# Patient Record
Sex: Female | Born: 1935 | Race: Black or African American | Hispanic: No | Smoking: Never smoker
Health system: Southern US, Community
[De-identification: ages and names within clinical notes are randomized; demographics above are authoritative.]

## PROBLEM LIST (undated history)

## (undated) DIAGNOSIS — N814 Uterovaginal prolapse, unspecified: Secondary | ICD-10-CM

## (undated) DIAGNOSIS — R748 Abnormal levels of other serum enzymes: Secondary | ICD-10-CM

## (undated) DIAGNOSIS — M1712 Unilateral primary osteoarthritis, left knee: Secondary | ICD-10-CM

## (undated) DIAGNOSIS — F0391 Unspecified dementia with behavioral disturbance: Secondary | ICD-10-CM

## (undated) DIAGNOSIS — Z8711 Personal history of peptic ulcer disease: Secondary | ICD-10-CM

## (undated) DIAGNOSIS — R002 Palpitations: Secondary | ICD-10-CM

## (undated) DIAGNOSIS — K219 Gastro-esophageal reflux disease without esophagitis: Secondary | ICD-10-CM

## (undated) DIAGNOSIS — J302 Other seasonal allergic rhinitis: Secondary | ICD-10-CM

## (undated) DIAGNOSIS — R451 Restlessness and agitation: Secondary | ICD-10-CM

## (undated) DIAGNOSIS — M1990 Unspecified osteoarthritis, unspecified site: Secondary | ICD-10-CM

## (undated) DIAGNOSIS — N3941 Urge incontinence: Secondary | ICD-10-CM

## (undated) DIAGNOSIS — I10 Essential (primary) hypertension: Secondary | ICD-10-CM

## (undated) DIAGNOSIS — R634 Abnormal weight loss: Secondary | ICD-10-CM

## (undated) DIAGNOSIS — D649 Anemia, unspecified: Secondary | ICD-10-CM

## (undated) DIAGNOSIS — F32A Depression, unspecified: Secondary | ICD-10-CM

## (undated) DIAGNOSIS — F329 Major depressive disorder, single episode, unspecified: Secondary | ICD-10-CM

## (undated) HISTORY — DX: Restlessness and agitation: R45.1

## (undated) HISTORY — PX: JOINT REPLACEMENT: SHX530

## (undated) HISTORY — DX: Essential (primary) hypertension: I10

## (undated) HISTORY — DX: Personal history of peptic ulcer disease: Z87.11

## (undated) HISTORY — DX: Unilateral primary osteoarthritis, left knee: M17.12

## (undated) HISTORY — PX: BREAST SURGERY: SHX581

## (undated) HISTORY — DX: Palpitations: R00.2

## (undated) HISTORY — DX: Anemia, unspecified: D64.9

## (undated) HISTORY — DX: Gastro-esophageal reflux disease without esophagitis: K21.9

## (undated) HISTORY — DX: Uterovaginal prolapse, unspecified: N81.4

## (undated) HISTORY — DX: Abnormal weight loss: R63.4

## (undated) HISTORY — DX: Unspecified dementia with behavioral disturbance: F03.91

## (undated) HISTORY — DX: Major depressive disorder, single episode, unspecified: F32.9

## (undated) HISTORY — DX: Urge incontinence: N39.41

## (undated) HISTORY — DX: Depression, unspecified: F32.A

## (undated) HISTORY — DX: Abnormal levels of other serum enzymes: R74.8

---

## 1997-12-09 ENCOUNTER — Ambulatory Visit (HOSPITAL_COMMUNITY): Admission: RE | Admit: 1997-12-09 | Discharge: 1997-12-09 | Payer: Self-pay | Admitting: *Deleted

## 1997-12-15 ENCOUNTER — Ambulatory Visit (HOSPITAL_COMMUNITY): Admission: RE | Admit: 1997-12-15 | Discharge: 1997-12-15 | Payer: Self-pay

## 1998-08-05 ENCOUNTER — Encounter: Admission: RE | Admit: 1998-08-05 | Discharge: 1998-08-05 | Payer: Self-pay | Admitting: Family Medicine

## 1998-08-06 ENCOUNTER — Encounter: Admission: RE | Admit: 1998-08-06 | Discharge: 1998-08-06 | Payer: Self-pay | Admitting: Family Medicine

## 1998-10-09 ENCOUNTER — Ambulatory Visit (HOSPITAL_COMMUNITY): Admission: RE | Admit: 1998-10-09 | Discharge: 1998-10-09 | Payer: Self-pay | Admitting: Family Medicine

## 1998-12-07 ENCOUNTER — Encounter: Admission: RE | Admit: 1998-12-07 | Discharge: 1998-12-07 | Payer: Self-pay | Admitting: Family Medicine

## 1999-03-19 ENCOUNTER — Encounter: Admission: RE | Admit: 1999-03-19 | Discharge: 1999-03-19 | Payer: Self-pay | Admitting: Sports Medicine

## 1999-10-25 ENCOUNTER — Encounter: Payer: Self-pay | Admitting: Family Medicine

## 1999-10-25 ENCOUNTER — Ambulatory Visit (HOSPITAL_COMMUNITY): Admission: RE | Admit: 1999-10-25 | Discharge: 1999-10-25 | Payer: Self-pay | Admitting: Family Medicine

## 2000-03-21 ENCOUNTER — Encounter: Admission: RE | Admit: 2000-03-21 | Discharge: 2000-03-21 | Payer: Self-pay | Admitting: Family Medicine

## 2000-08-28 ENCOUNTER — Encounter: Admission: RE | Admit: 2000-08-28 | Discharge: 2000-08-28 | Payer: Self-pay | Admitting: Family Medicine

## 2000-10-27 ENCOUNTER — Ambulatory Visit (HOSPITAL_COMMUNITY): Admission: RE | Admit: 2000-10-27 | Discharge: 2000-10-27 | Payer: Self-pay | Admitting: Family Medicine

## 2000-10-27 ENCOUNTER — Encounter: Payer: Self-pay | Admitting: Family Medicine

## 2000-11-02 ENCOUNTER — Encounter: Admission: RE | Admit: 2000-11-02 | Discharge: 2000-11-02 | Payer: Self-pay | Admitting: Family Medicine

## 2001-03-21 ENCOUNTER — Encounter: Admission: RE | Admit: 2001-03-21 | Discharge: 2001-03-21 | Payer: Self-pay | Admitting: Family Medicine

## 2001-08-21 ENCOUNTER — Encounter (INDEPENDENT_AMBULATORY_CARE_PROVIDER_SITE_OTHER): Payer: Self-pay | Admitting: *Deleted

## 2001-08-21 LAB — CONVERTED CEMR LAB

## 2001-08-30 ENCOUNTER — Encounter (INDEPENDENT_AMBULATORY_CARE_PROVIDER_SITE_OTHER): Payer: Self-pay | Admitting: *Deleted

## 2001-08-30 ENCOUNTER — Encounter: Admission: RE | Admit: 2001-08-30 | Discharge: 2001-08-30 | Payer: Self-pay | Admitting: Family Medicine

## 2001-10-31 ENCOUNTER — Encounter: Admission: RE | Admit: 2001-10-31 | Discharge: 2001-10-31 | Payer: Self-pay | Admitting: Family Medicine

## 2001-11-02 ENCOUNTER — Ambulatory Visit (HOSPITAL_COMMUNITY): Admission: RE | Admit: 2001-11-02 | Discharge: 2001-11-02 | Payer: Self-pay | Admitting: Family Medicine

## 2001-11-02 ENCOUNTER — Encounter: Payer: Self-pay | Admitting: Family Medicine

## 2002-03-04 ENCOUNTER — Encounter: Admission: RE | Admit: 2002-03-04 | Discharge: 2002-03-04 | Payer: Self-pay | Admitting: Family Medicine

## 2002-09-12 ENCOUNTER — Encounter: Admission: RE | Admit: 2002-09-12 | Discharge: 2002-09-12 | Payer: Self-pay | Admitting: Family Medicine

## 2002-11-05 ENCOUNTER — Ambulatory Visit (HOSPITAL_COMMUNITY): Admission: RE | Admit: 2002-11-05 | Discharge: 2002-11-05 | Payer: Self-pay | Admitting: Family Medicine

## 2003-03-19 ENCOUNTER — Encounter: Admission: RE | Admit: 2003-03-19 | Discharge: 2003-03-19 | Payer: Self-pay | Admitting: Family Medicine

## 2003-07-21 ENCOUNTER — Encounter: Admission: RE | Admit: 2003-07-21 | Discharge: 2003-07-21 | Payer: Self-pay | Admitting: Family Medicine

## 2003-07-24 ENCOUNTER — Encounter: Admission: RE | Admit: 2003-07-24 | Discharge: 2003-07-24 | Payer: Self-pay | Admitting: Family Medicine

## 2003-11-11 ENCOUNTER — Ambulatory Visit (HOSPITAL_COMMUNITY): Admission: RE | Admit: 2003-11-11 | Discharge: 2003-11-11 | Payer: Self-pay | Admitting: Family Medicine

## 2003-11-20 ENCOUNTER — Encounter: Admission: RE | Admit: 2003-11-20 | Discharge: 2003-11-20 | Payer: Self-pay | Admitting: Sports Medicine

## 2003-12-02 ENCOUNTER — Encounter: Admission: RE | Admit: 2003-12-02 | Discharge: 2003-12-02 | Payer: Self-pay | Admitting: Family Medicine

## 2003-12-25 ENCOUNTER — Encounter: Admission: RE | Admit: 2003-12-25 | Discharge: 2003-12-25 | Payer: Self-pay | Admitting: Family Medicine

## 2004-02-25 ENCOUNTER — Ambulatory Visit: Payer: Self-pay | Admitting: Family Medicine

## 2004-03-08 ENCOUNTER — Ambulatory Visit: Payer: Self-pay | Admitting: Sports Medicine

## 2004-03-18 ENCOUNTER — Ambulatory Visit: Payer: Self-pay | Admitting: Family Medicine

## 2004-11-11 ENCOUNTER — Ambulatory Visit (HOSPITAL_COMMUNITY): Admission: RE | Admit: 2004-11-11 | Discharge: 2004-11-11 | Payer: Self-pay | Admitting: Family Medicine

## 2004-12-06 ENCOUNTER — Ambulatory Visit: Payer: Self-pay | Admitting: Family Medicine

## 2005-04-04 ENCOUNTER — Ambulatory Visit: Payer: Self-pay | Admitting: Family Medicine

## 2005-11-28 ENCOUNTER — Ambulatory Visit: Payer: Self-pay | Admitting: Family Medicine

## 2005-12-06 ENCOUNTER — Ambulatory Visit (HOSPITAL_COMMUNITY): Admission: RE | Admit: 2005-12-06 | Discharge: 2005-12-06 | Payer: Self-pay | Admitting: Family Medicine

## 2005-12-14 ENCOUNTER — Encounter: Admission: RE | Admit: 2005-12-14 | Discharge: 2005-12-14 | Payer: Self-pay | Admitting: Sports Medicine

## 2006-02-09 ENCOUNTER — Ambulatory Visit: Payer: Self-pay | Admitting: Family Medicine

## 2006-05-25 ENCOUNTER — Ambulatory Visit: Payer: Self-pay | Admitting: Family Medicine

## 2006-07-20 DIAGNOSIS — IMO0002 Reserved for concepts with insufficient information to code with codable children: Secondary | ICD-10-CM | POA: Insufficient documentation

## 2006-07-20 DIAGNOSIS — M1710 Unilateral primary osteoarthritis, unspecified knee: Secondary | ICD-10-CM

## 2006-07-20 DIAGNOSIS — N951 Menopausal and female climacteric states: Secondary | ICD-10-CM | POA: Insufficient documentation

## 2006-07-21 ENCOUNTER — Encounter (INDEPENDENT_AMBULATORY_CARE_PROVIDER_SITE_OTHER): Payer: Self-pay | Admitting: *Deleted

## 2006-10-19 ENCOUNTER — Ambulatory Visit: Payer: Self-pay | Admitting: Family Medicine

## 2006-10-20 ENCOUNTER — Telehealth: Payer: Self-pay | Admitting: Family Medicine

## 2006-10-20 DIAGNOSIS — R030 Elevated blood-pressure reading, without diagnosis of hypertension: Secondary | ICD-10-CM

## 2006-10-30 ENCOUNTER — Telehealth: Payer: Self-pay | Admitting: *Deleted

## 2006-10-30 ENCOUNTER — Encounter: Payer: Self-pay | Admitting: Family Medicine

## 2006-11-20 ENCOUNTER — Telehealth: Payer: Self-pay | Admitting: *Deleted

## 2006-11-21 ENCOUNTER — Ambulatory Visit: Payer: Self-pay | Admitting: Family Medicine

## 2006-12-26 ENCOUNTER — Ambulatory Visit (HOSPITAL_COMMUNITY): Admission: RE | Admit: 2006-12-26 | Discharge: 2006-12-26 | Payer: Self-pay | Admitting: Family Medicine

## 2006-12-27 ENCOUNTER — Encounter: Payer: Self-pay | Admitting: Family Medicine

## 2007-03-29 ENCOUNTER — Ambulatory Visit: Payer: Self-pay | Admitting: Family Medicine

## 2007-06-15 ENCOUNTER — Ambulatory Visit: Payer: Self-pay | Admitting: Family Medicine

## 2007-06-15 ENCOUNTER — Encounter: Payer: Self-pay | Admitting: *Deleted

## 2007-06-15 ENCOUNTER — Telehealth: Payer: Self-pay | Admitting: *Deleted

## 2007-06-15 ENCOUNTER — Encounter: Payer: Self-pay | Admitting: Family Medicine

## 2007-06-15 DIAGNOSIS — D62 Acute posthemorrhagic anemia: Secondary | ICD-10-CM | POA: Insufficient documentation

## 2007-06-15 DIAGNOSIS — I951 Orthostatic hypotension: Secondary | ICD-10-CM | POA: Insufficient documentation

## 2007-06-18 LAB — CONVERTED CEMR LAB
ALT: 11 units/L (ref 0–35)
BUN: 42 mg/dL — ABNORMAL HIGH (ref 6–23)
CO2: 22 meq/L (ref 19–32)
MCV: 100.7 fL — ABNORMAL HIGH (ref 78.0–100.0)
RBC: 2.86 M/uL — ABNORMAL LOW (ref 3.87–5.11)
RDW: 13.2 % (ref 11.5–15.5)
Sodium: 141 meq/L (ref 135–145)

## 2007-08-09 ENCOUNTER — Telehealth: Payer: Self-pay | Admitting: *Deleted

## 2007-08-10 ENCOUNTER — Ambulatory Visit: Payer: Self-pay | Admitting: Family Medicine

## 2007-08-10 ENCOUNTER — Encounter: Payer: Self-pay | Admitting: Family Medicine

## 2007-10-08 ENCOUNTER — Ambulatory Visit: Payer: Self-pay | Admitting: Family Medicine

## 2007-10-11 ENCOUNTER — Telehealth (INDEPENDENT_AMBULATORY_CARE_PROVIDER_SITE_OTHER): Payer: Self-pay | Admitting: Family Medicine

## 2007-10-29 ENCOUNTER — Inpatient Hospital Stay (HOSPITAL_COMMUNITY): Admission: RE | Admit: 2007-10-29 | Discharge: 2007-11-02 | Payer: Self-pay | Admitting: Orthopedic Surgery

## 2008-02-15 ENCOUNTER — Ambulatory Visit: Payer: Self-pay | Admitting: Family Medicine

## 2008-04-11 ENCOUNTER — Telehealth: Payer: Self-pay | Admitting: *Deleted

## 2008-06-11 ENCOUNTER — Encounter: Payer: Self-pay | Admitting: Family Medicine

## 2008-09-03 ENCOUNTER — Telehealth: Payer: Self-pay | Admitting: Family Medicine

## 2008-09-16 ENCOUNTER — Telehealth: Payer: Self-pay | Admitting: *Deleted

## 2008-10-29 ENCOUNTER — Ambulatory Visit: Payer: Self-pay | Admitting: Family Medicine

## 2008-10-29 DIAGNOSIS — C4499 Other specified malignant neoplasm of skin, unspecified: Secondary | ICD-10-CM

## 2009-07-21 ENCOUNTER — Telehealth: Payer: Self-pay | Admitting: Family Medicine

## 2009-11-12 ENCOUNTER — Ambulatory Visit: Payer: Self-pay | Admitting: Family Medicine

## 2009-11-12 ENCOUNTER — Encounter: Payer: Self-pay | Admitting: Family Medicine

## 2009-11-12 DIAGNOSIS — Z8711 Personal history of peptic ulcer disease: Secondary | ICD-10-CM

## 2009-11-12 HISTORY — DX: Personal history of peptic ulcer disease: Z87.11

## 2009-11-12 LAB — CONVERTED CEMR LAB
ALT: 8 units/L (ref 0–35)
Alkaline Phosphatase: 71 units/L (ref 39–117)
CO2: 24 meq/L (ref 19–32)
Chloride: 103 meq/L (ref 96–112)
Glucose, Bld: 89 mg/dL (ref 70–99)
MCHC: 32.3 g/dL (ref 30.0–36.0)
MCV: 101 fL — ABNORMAL HIGH (ref 78.0–100.0)
RBC: 3.99 M/uL (ref 3.87–5.11)
RDW: 13.2 % (ref 11.5–15.5)
Sodium: 139 meq/L (ref 135–145)

## 2009-11-16 ENCOUNTER — Telehealth: Payer: Self-pay | Admitting: Family Medicine

## 2009-12-14 ENCOUNTER — Encounter: Payer: Self-pay | Admitting: Family Medicine

## 2010-02-15 ENCOUNTER — Telehealth: Payer: Self-pay | Admitting: Family Medicine

## 2010-02-18 ENCOUNTER — Encounter: Payer: Self-pay | Admitting: Family Medicine

## 2010-03-03 ENCOUNTER — Telehealth: Payer: Self-pay | Admitting: Family Medicine

## 2010-06-13 ENCOUNTER — Encounter: Payer: Self-pay | Admitting: Family Medicine

## 2010-06-24 NOTE — Progress Notes (Signed)
Summary: refill   Phone Note Refill Request Call back at Home Phone [phone] Message from:  Patient  Refills Requested: Medication #1:  NAPROSYN 500 MG  TABS One tablet two to three times daily as needed for osteoarthritis pain Disp:100 tab [facility]  Initial call taken by: De Nurse,  July 21, 2009 2:35 PM  Follow-up for Phone Call        I will fill her a 1 month supply, no refills.  She needs to make an appt to come see me before continuing this medication.  Thanks Follow-up by: Renold Don MD,  July 21, 2009 2:48 PM  Additional Follow-up for Phone Call Additional follow up Details #1::        Can this be printed and placed in to be faxed slot?      Prescriptions: NAPROSYN 500 MG  TABS (NAPROXEN) One tablet two to three times daily as needed for osteoarthritis pain Disp:100 tab  #90 x 0   Entered and Authorized by:   Renold Don MD   Signed by:   Renold Don MD on 07/23/2009   Method used:   Print then Give to Patient   RxID:   [id number] NAPROSYN 500 MG  TABS (NAPROXEN) One tablet two to three times daily as needed for osteoarthritis pain Disp:100 tab  #90 x 0   Entered and Authorized by:   Renold Don MD   Signed by:   Renold Don MD on 07/23/2009   Method used:   Print then Give to Patient   RxID:   [id number]   Appended Document: refill faxed to insurance company

## 2010-06-24 NOTE — Progress Notes (Signed)
   Phone Note Outgoing Call   Call placed by: Renold Don MD,  November 16, 2009 1:42 PM Call placed to: Patient Summary of Call: Spoke with patient and relayed her latest lab work.  Patient pleased with results.  Renold Don MD  November 16, 2009 1:42 PM

## 2010-06-24 NOTE — Miscellaneous (Signed)
Summary: [facility] refill   Clinical Lists Changes pt brought in a form for md to complete & refill her naproxyn. to pcp.Golden Circle RN  December 14, 2009 4:43 PM  Completed and given left on Triage Nurse desk, was after 5 pm today. Renold Don MD  December 16, 2009 8:55 PM   Appended Document: [facility] refill faxed to [facility]

## 2010-06-24 NOTE — Assessment & Plan Note (Signed)
Summary: place on stomach & ck up,tcb   Vital Signs:  Patient profile:   77 year old female Height:      63 inches Weight:      147.4 pounds BMI:     26.21 Temp:     98.4 degrees F oral Pulse rate:   68 / minute BP sitting:   146 / 79  (left arm) Cuff size:   regular  Vitals Entered By: Gladstone Pih (November 12, 2009 1:58 PM)  CC: check up Is Patient Diabetic? No Pain Assessment Patient in pain? no        Primary Care Provider:  Renold Don MD  CC:  check up.  History of Present Illness: CC:  well woman check  1.  Well woman check:  Patient states she keeps very close control on her health.  Takes multiple supplements.  States she has had problem with anemia in past with distant rectal bleeding, but none for many years.    2.  "Spot" on stomach:  Patient originally seen June of last year for a mole on her stomach.  Set up with Dermatology at that time, but did not follow up due to scheduling problems.  Said she kept calling dermatologist but he was always out of town.  First noticed lesion age 18, no change in color or size since then.  No itching, bleeding at site.  ROS:  no headaches, pre-syncopal or syncopal episodes, chest pain, palpitations, shortness of breath or dyspnea, abdominal pain, diarrhea or constipation, melena, hematochezia, lower extremity swelling.      Habits & Providers  Alcohol-Tobacco-Diet     Tobacco Status: never  Current Problems (verified): 1)  Personal History of Peptic Ulcer Disease  (ICD-V12.71) 2)  Other Malignant Neoplasm Other Spec Sites Skin  (ICD-173.8) 3)  Need Prophylactic Vaccination&inoculation Flu  (ICD-V04.81) 4)  Anemia, Secondary To Acute Blood Loss  (ICD-285.1) 5)  Postural Hypotension  (ICD-458.0) 6)  Need Prophylactic Vaccination&inoculation Flu  (ICD-V04.81) 7)  Abnormal Findings, Elevated Bp w/o Htn  (ICD-796.2) 8)  Osteoarthritis, Lower Leg  (ICD-715.96) 9)  Menopausal Syndrome  (ICD-627.2)  Current Medications  (verified): 1)  Naprosyn 500 Mg  Tabs (Naproxen) .... One Tablet Two To Three Times Daily As Needed For Osteoarthritis Pain Disp:100 Tab 2)  Omeprazole 40 Mg  Cpdr (Omeprazole) .Marland Kitchen.. 1 Tab By Mouth Daily 3)  Caltrate 600+d 600-400 Mg-Unit Tabs (Calcium Carbonate-Vitamin D) 4)  Centrum Silver  Tabs (Multiple Vitamins-Minerals) 5)  Ginkgo 60 Mg Tabs (Ginkgo Biloba) 6)  Potassium Gluconate 595 Mg Tabs (Potassium Gluconate)  Allergies (verified): No Known Drug Allergies   Past History:  Past medical, surgical, family and social histories (including risk factors) reviewed, and no changes noted (except as noted below).  Past Medical History: Reviewed history from 10/19/2006 and no changes required. 10-year risk for CV event = 4%,  Abnormal Mammogram, L. Breast (07/07),  HDL=62 08/2001,  Hx Anemia,  [place]. and [place] with DMT2,  No further Pap screening required per USPSTF guide,  OtherMeds:`VitalJoints`, Ginko, B-carotene, Soy, Overweight, Estoven  Self checks CBGs and BP,   Past Surgical History: calcaneal Scan T-score= (-0.34 ) on 12/11/2003 Right knee replacement June, 2009  Family History: Reviewed history from 07/20/2006 and no changes required. DMT2 in [place] And [place];  HTN in [place], Fa, Bro, Sis; Sister w/ CHF; 2 sisters with CABG., No Cancers  Social History: Reviewed history from 07/20/2006 and no changes required. Jehovah's Witness( No Blood Products). Married, 4 children, Naturalized [place]  citizen, originally from [place], Retired from [employer] in [employer], formerly worked as Lawyer for Alzheimer patient. Currently retired. Cares for husband who has multiple chronic medical problems. Embroidery for relaxation. No tobacco. Only occasional alcohol in moderation, and no illicit drugs.  Physical Exam  Additional Exam:  Vital signs reviewed, on repeat pressure was 135 systolic.   Gen: Well-developed, well-nourished, NAD, alert and cooperative throughout exam HEENT: NCAT, PERRL, EOMI, no  conjunctival inflammation, pharynx nl without erythema or exudates, MMM. External ear exam normal - TMs without bulging or erythema, good landmarks.  No nasal d/c Neck: No LAN, thyromegaly, masses, bruits CV: Regular rate and rhythm.  Grade II/VI SEM noted non-radiating Lungs: Normal respiratory effort. CTAB no wheezes, rales, or rhonchi noted Abd: soft, nondistended, nontender.  BS+ and normoactive.  No hepatosplenomegaly.  No masses. Ext: warm and well perfused.  No edema or cyanosis. Skin: 2 mm mole located on abdomen.  Dark in color.  Regular borders     Impression & Recommendations:  Problem # 1:  Preventive Health Care (ICD-V70.0) Assessment Unchanged Patient checks blood pressures at home every night on her husband's blood pressure machine.  States that her pressures run 120s systolic.  Also occasionally checks CBGs on husband's glucometer, never elevated.  Recommended she bring in her blood pressure machine to make sure that it matches with our blood pressure machine here.  Physical exam reassuring today except for new onset heart murmur, not mentioned before in physical exam.  No radiation to carotids, no syncopal or presyncopal episodes.  most likely aortic sclerosis rather than stenosis.  Problem # 2:  PERSONAL HISTORY OF PEPTIC ULCER DISEASE (ICD-V12.71) Assessment: Improved  On further review of chart, found history of dark tarry stools in 2009.  Diagnosed secondary to Naprosyn use without concurrent PPI.  Patient still taking both.  States she has cut down on Naprosyn use since having knee replacement last year.  Denies any current tarry stools or change in her bowel habits.  Plan to check CBC today. Her updated medication list for this problem includes:    Omeprazole 40 Mg Cpdr (Omeprazole) .Marland Kitchen... 1 tab by mouth daily  Orders: [facility]- Est  Level 4 (99214)  Problem # 3:  OTHER MALIGNANT NEOPLASM OTHER SPEC SITES SKIN (ICD-173.8) Assessment: Unchanged Reassuring based on  physical exam.  Recommended patient keep appointment with dermatologist to make sure that she does not have any further changes and see if he has any further recommendations.  Has not changed in size for almost twenty years.  Will follow  Orders: [facility]- Est  Level 4 (04540)  Complete Medication List: 1)  Naprosyn 500 Mg Tabs (Naproxen) .... One tablet two to three times daily as needed for osteoarthritis pain disp:100 tab 2)  Omeprazole 40 Mg Cpdr (Omeprazole) .Marland Kitchen.. 1 tab by mouth daily 3)  Caltrate 600+d 600-400 Mg-unit Tabs (Calcium carbonate-vitamin d) 4)  Centrum Silver Tabs (Multiple vitamins-minerals) 5)  Ginkgo 60 Mg Tabs (Ginkgo biloba) 6)  Potassium Gluconate 595 Mg Tabs (Potassium gluconate)  Other Orders: Comp Met-[facility] (98119-14782) CBC-[facility] (95621)

## 2010-06-24 NOTE — Progress Notes (Signed)
   Phone Note Call from Patient   Caller: Patient Summary of Call: Wanda Conner called regarding refill for her Naproxen.  According to her and [facility], they still haven't received the information.  I looked in all the previous faxes from 9/26 thru 10/4 and couldn't find a copy of fax.  Would you please  resend rx to them. Initial call taken by: Abundio Miu,  March 03, 2010 3:01 PM  Follow-up for Phone Call        Wanda Conner want her Naproxen to be sent to [facility] on [address].  She need it now and will get from them instead of having to wait on [facility]. Follow-up by: Abundio Miu,  March 04, 2010 10:35 AM  Additional Follow-up for Phone Call Additional follow up Details #1::        Filled and sent to [facility] Additional Follow-up by: Renold Don MD,  March 04, 2010 8:23 PM    Prescriptions: NAPROSYN 500 MG  TABS (NAPROXEN) One tablet two to three times daily as needed for osteoarthritis pain Disp:100 tab  #90 x 1   Entered and Authorized by:   Renold Don MD   Signed by:   Renold Don MD on 03/04/2010   Method used:   Electronically to        [facility]  [phone]* (retail)       [address]       [place], [place]  [address]       Ph: [phone] or [phone]       Fax: [phone]   RxID:   [id number] NAPROSYN 500 MG  TABS (NAPROXEN) One tablet two to three times daily as needed for osteoarthritis pain Disp:100 tab  #90 x 1   Entered and Authorized by:   Renold Don MD   Signed by:   Renold Don MD on 03/04/2010   Method used:   Electronically to        [facility]* (retail)             ,          Ph: [phone]       Fax: [phone]   RxID:   [id number]

## 2010-06-24 NOTE — Miscellaneous (Signed)
Summary: RX  Patient contacted [facility] and they did not receive rx for Naproxen.  They need the form to be filled out again with the rx.  Papers placed in dr's box. Bradly Bienenstock  February 18, 2010 9:16 AM  Completed forms and placed in to be faxed pile.  Renold Don MD  February 18, 2010 8:40 PM

## 2010-06-24 NOTE — Progress Notes (Signed)
Summary: refill   Phone Note Refill Request Call back at Home Phone [phone] Message from:  Patient  Refills Requested: Medication #1:  NAPROSYN 500 MG  TABS One tablet two to three times daily as needed for osteoarthritis pain Disp:100 tab [facility]   Initial call taken by: De Nurse,  February 15, 2010 9:48 AM  Follow-up for Phone Call        Completed and faxed back to [facility] last week.   Follow-up by: Renold Don MD,  February 15, 2010 11:35 AM

## 2010-10-05 NOTE — Op Note (Signed)
NAMEBENICIA, Wanda Conner               ACCOUNT NO.:  [id number]   MEDICAL RECORD NO.:  [id number]          PATIENT TYPE:  INP   LOCATION:  5018                         FACILITY:  [facility]   PHYSICIAN:  Feliberto Gottron. Turner Daniels, M.D.   DATE OF BIRTH:  July 26, 1933   DATE OF PROCEDURE:  10/29/2007  DATE OF DISCHARGE:                               OPERATIVE REPORT   PREOPERATIVE DIAGNOSIS:  End-stage arthritis of the right knee with  varus deformity.   POSTOPERATIVE DIAGNOSIS:  End-stage arthritis of the right knee with  varus deformity.   PROCEDURE:  Right total knee arthroplasty using DePuy Sigma RP  components, all cemented with a double batch of DePuy HV cement with  1500 mg of Zinacef were used at 3 femur, 3 tibia, 38 patella and a 10-mm  Sigma RP polyethylene spacer.   SURGEON:  Feliberto Gottron.  Turner Daniels, MD   FIRST ASSISTANT:  Zonia Kief, PA-C   ANESTHETIC:  General endotracheal.   ESTIMATED BLOOD LOSS:  Minimal.   FLUID REPLACEMENT:  59 mL of crystalloid.   DRAINS PLACED:  Two medium Hemovacs.   TOURNIQUET TIME:  1 hour and 30 minutes.   INDICATIONS FOR PROCEDURE:  A 77 year old woman with end-stage arthritis  of her right knee desires elective right total knee arthroplasty.  She  has failed conservative treatment and anti-inflammatory medicines,  exercises, has a severe end-stage bone-on-bone arthritic changes with  medial compartment arthritis and erosion and varus deformity.  She also  cannot flex her knee past 90 degrees, has trouble with walking and  activities of daily living and desires elective right total knee  arthroplasty   DESCRIPTION OF PROCEDURE:  The patient was identified by arm band,  underwent right femoral nerve block in the block area at [facility].  She was then taken to the operating room 10 where the  appropriate anesthetic monitors were reattached and general endotracheal  anesthesia induced with the patient in the supine position.  A Foley  catheter was  inserted.  Tourniquet applied high to the right thigh and  the right lower extremity prepped and draped in a sterile fashion from  the ankle to the tourniquet.  The limb was wrapped with an Esmarch  bandage, tourniquet inflated 350 mmHg.  With the knee bent, we began the  procedure by making anterior midline incision starting handbreadth above  the patella going over the patella 1 cm distal, medial 2 cm distal to  the tibial tubercle.  We cut through the skin and subcutaneous tissue  down the transverse retinaculum which was reflected medially allowing a  medial parapatellar arthrotomy.  Parenthetically, a foot position on  lateral post had been applied to the table prior to the prep.  The  patella was everted.  The prepatellar fat pad was resected.  The  superficial medial collateral ligament was elevated from anterior to  posterior off the proximal tibia but left intact distally.  At this  point, the knee was then hyperflexed with the patella everted allowing  exposure of the distal femur which had some very prominent medial  osteophytes  as did the proximal tibia.  The knee was removed with a 1/2  inch osteotome as well as rongeurs.  The notch was quite stenotic  requiring removal of osteophytes again with the 1/2 inch osteotome.  This allowed resection of the cruciate ligaments greatly freeing up the  knee and allowing flexion to about 130 degrees.  With the knee now  hyperflexed, a posteromedial Z retractor was placed, a McCool through  the notch and a lateral Hohmann.  This allowed [place] to enter the proximal  tibia along the axis of the tibial canal with the step drill from DePuy  followed by the IM rod in a 0 degree posterior slope cutting guide.  The  cutting guide was set to remove 1-2 mm of bone medially, 8-9 mm of bone  laterally and pinned in place.  The rod was removed and the proximal  tibia cut accomplished without difficulty, greatly relieving the  deformity.  We then entered  the distal femur with a step drill from  DePuy 2 mm anterior to the PCL origin  followed by the IM rod and a 5  degree right distal femoral cutting guide set at 11 mm.  This was pinned  in place along the epicondylar axis and the distal femoral cut  accomplished.  We then measured using the posterior referencing femoral  sizing guide for a 3 femur, set the pins in 3 degrees of external  rotation and hammered in place with a 3 femoral chamfer cutting guide.  This was then held in place with a calibrated towel clips.  The anterior-  posterior chamfer cuts accomplished without difficulty followed by the  Sigma RP box cutting guide and the box cut.  We then brought the knee in  extension and measured for a 10 mm spacer block and this had the correct  fit and fill.  The patella was then measured at 23 mm.  The cutting  guide set at 14 anticipating a 38-mm patella and the posterior 9 mm of  the patella resected size of 38 button and drilled.  The knee was then  once again hyperflexed exposing the proximal tibia with retractors  placed.  We sized for a 3 tibial tray, pinned the guide in place,  followed by the smokestack and the conical reamer, followed by the  Deltafit keel and bullet tip.  We then hammered into place the three  right distal trial femoral components, drilled the lugs, inserted a 10-  mm Sigma 3 trial spacer, and reduced the knee.  It did come to full  extension with good ligamentous stability and easily flexed to 130  degrees again with good ligamentous stability.  The trial patella  tracked with no thumb pressure.  At this point, the trial components  were removed and the bony surfaces Waterpik, cleaned and dried with  suction and sponges.  A double batch of DePuy HV cement with 1500 mg of  Zinacef was then mixed, applied to all bony metallic mating surfaces  except for the posterior condyles of the femur itself.  The knee was  once again exposed and hyperflexed with the bone  surfaces again dried,  suction and sponges.  We hammered into place a #3 tibial baseplate and  removed excess cement, a 3 right femoral component and removed excess  cement, inserted a 10 mm Sigma RP spacer for the 3 femur, reduced the  knee, inserted the patellar button and clamped it, brought the knee to  full extension and  held the knee in compression as the cement cured and  also irrigated out the wound with pulse lavage normal saline solution  and inserted a  medium Hemovac drain.  After the cemented had cured, the  patella was unclamped.  We checked for tracking one more time.  Satisfied, we closed the parapatellar arthrotomy with running #1 Vicryl  suture, the subcutaneous tissue with 0 and 2-0 undyed Vicryl suture and  the skin with skin staples.  A dressing of Xeroform, 4x4 dressing  sponges, Webril and an Ace wrap was then applied.  The tourniquet was  let down.  The patient was awakened and taken to the recovery room  without difficulty.      Feliberto Gottron. Turner Daniels, M.D.  Electronically Signed     FJR/MEDQ  D:  10/29/2007  T:  10/30/2007  Job:  [id number]

## 2010-10-05 NOTE — Discharge Summary (Signed)
NAMETASMINE, HIPWELL               ACCOUNT NO.:  [id number]   MEDICAL RECORD NO.:  [id number]          PATIENT TYPE:  INP   LOCATION:  5018                           FACILITY:   PHYSICIAN:  Feliberto Gottron. Turner Daniels, M.D.   DATE OF BIRTH:  1933/09/22   DATE OF ADMISSION:  10/29/2007  DATE OF DISCHARGE:  11/02/2007                               DISCHARGE SUMMARY   CHIEF COMPLAINT:  Right knee pain.   HISTORY OF PRESENT ILLNESS:  Wanda Conner is a 77 year old lady with  unremitting knee pain despite conservative treatment including steroid  injection, physical therapy, NSAIDs, and narcotic pain medicine.  Her x-  rays demonstrate bone-on-bone degenerative joint disease in the right  knee, and she desires surgical intervention.   PAST MEDICAL HISTORY:  Significant for gastroesophageal reflux disease.   PAST SURGICAL HISTORY:  She had a D&C for endometrial bleeding, and she  has had a cyst removed from her left breast.   SOCIAL HISTORY:  She is a nonsmoker.  She occasionally drinks alcohol.  She is married.   FAMILY HISTORY:  Significant for myocardial infarction, diabetes  mellitus, and cerebral vascular accident.   ALLERGIES:  She has an allergy to ERYTHROMYCIN.   MEDICATIONS:  1. Naproxen 500 mg b.i.d.  2. Niacin 100 mg daily.  3. Prilosec 20 mg daily.  4. She takes Ester 500 mg daily.  5. Multivitamin.  6. Echinacea.  7. She takes vitamin E 400 internal units.  8. Vitamin B12 200 mcg daily.  9. Vitamin B6 100 mg daily.  10.Lutein 6 mg daily.  11.Iron 65 mg daily.   PHYSICAL EXAMINATION:  Examination of the right knee demonstrates the  patient has a varus deformity.  Range of motion is estimated to be 5-90  degrees.  She walks with an antalgic gait.  She is neurovascularly  intact.   Her x-rays demonstrate bone-on-bone degenerative joint disease of the  right knee.   PREOPERATIVE LABS:  White blood cells 9.4, red blood cells 4.15,  hemoglobin 13.4, hematocrit 38.6, platelets  245, PT 14.1, and INR 1.1.  Her urinalysis was within normal limits.   HOSPITAL COURSE:  Ms. Hazelett was admitted on October 29, 2007, when she  underwent right total knee arthroplasty.  She tolerated the procedure  well.  Her Foley catheter was removed that evening because she was  having discomfort.  On the first postoperative day, she was complaining  of urinary retention, so her Foley was put back in.  The pain pump was  discontinued.  She was moving from the bed to the chair with physical  therapy.  On the second postoperative day, she walked in her room with  physical therapy, and her Foley catheter was taken out.  On the third  postoperative day, she denied any urinary retention and was able to walk  100 feet with physical therapy.  She was eating well.  On the fourth  postoperative day, she was eating well, passing stool and flatus and not  complaining of urinary retention, so she was discharged to her home on  November 02, 2007.  DISPOSITION:  The patient will be discharged to home on November 02, 2007.  [facility] will manage her wound, Coumadin, and physical  therapy.   DISCHARGE MEDICATIONS:  Will be as per the HMR with the additions of  Coumadin 5/500 mg tablets 1 tablet p.o. q.4 h. p.r.n. pain and Robaxin  500 mg tablets 1 tab p.o. t.i.d. p.r.n. spasm #60, and Coumadin 5 mg  tablets take as directed and she will return to the clinic in 1 week to  see Dr. Turner Daniels.  In the meantime, she will be weightbearing as tolerated  using a walker with physical therapy.   FINAL DIAGNOSIS:  Right knee end-stage degenerative joint disease.      [name], PA      Feliberto Gottron. Turner Daniels, M.D.  Electronically Signed    JW/MEDQ  D:  11/02/2007  T:  11/03/2007  Job:  [id number]

## 2010-12-07 ENCOUNTER — Encounter: Payer: Self-pay | Admitting: Family Medicine

## 2010-12-28 ENCOUNTER — Ambulatory Visit (INDEPENDENT_AMBULATORY_CARE_PROVIDER_SITE_OTHER): Payer: Medicare Other | Admitting: Family Medicine

## 2010-12-28 ENCOUNTER — Encounter: Payer: Self-pay | Admitting: Family Medicine

## 2010-12-28 DIAGNOSIS — Z0000 Encounter for general adult medical examination without abnormal findings: Secondary | ICD-10-CM

## 2010-12-28 DIAGNOSIS — N3941 Urge incontinence: Secondary | ICD-10-CM

## 2010-12-28 DIAGNOSIS — R030 Elevated blood-pressure reading, without diagnosis of hypertension: Secondary | ICD-10-CM

## 2010-12-28 DIAGNOSIS — Z8711 Personal history of peptic ulcer disease: Secondary | ICD-10-CM

## 2010-12-28 MED ORDER — OXYBUTYNIN CHLORIDE ER 10 MG PO TB24: 10.0000 mg | ORAL_TABLET | Freq: Every day | ORAL | Status: DC

## 2010-12-29 ENCOUNTER — Encounter: Payer: Self-pay | Admitting: Family Medicine

## 2010-12-29 DIAGNOSIS — N3941 Urge incontinence: Secondary | ICD-10-CM

## 2010-12-29 DIAGNOSIS — Z0000 Encounter for general adult medical examination without abnormal findings: Secondary | ICD-10-CM | POA: Insufficient documentation

## 2010-12-29 HISTORY — DX: Urge incontinence: N39.41

## 2010-12-29 NOTE — Assessment & Plan Note (Signed)
Long discussion with patient about need for screening and preventative health maintenance.  Patient again declines any colon cancer screening or vaccinations.  Will continue to encourage at future visits, but patient is competent adult capable of making her own decisions.

## 2010-12-29 NOTE — Progress Notes (Signed)
  Subjective:    Patient ID: Wanda Conner, female    DOB: [birth year], 77 y.o.   MRN: [id number]  HPI 1.   Hypertension:  Patient with long-time history of elevated blood pressures.  New diagnosis as of today's visit.  Has never taken any HTN medications, not interested in taking any in the future.  Self-treats with exercise and OTC herbal medications.  Checks it regularly at home and states it always runs 130s systolic.  Has never calibrated her sphygmomanometer with ours here in clinic.  No HA, CP, dizziness, shortness of breath, palpitations, or LE swelling.   BP Readings from Last 3 Encounters:  12/28/10 168/81  11/12/09 146/79  10/29/08 166/89  Repeat blood pressure 145/80 tested by physician in exam room after she had been resting for some time.    2.  Incontinence:  Urge by history.  Urge to urinate arises suddenly.  Has had a few accidents in past and at night when she doesn't make it to rest room in time.  No problems with laughing, coughing sneezing and incontinence.  3.  Preventative medicine:  Please see problem list.  Long conversation with patient about health maintenance and screenings.    Review of Systems The patient denies fever, unusual weight change, decreased hearing, chest pain, palpitations, pre-syncopal or syncopal episodes, dyspnea on exertion, prolonged cough, hemoptysis, change in bowel habits, melena, hematochezia, severe indigestion/heartburn, nausea/vomiting/abdominal pain, genital sores, muscle weakness, difficulty walking, abnormal bleeding, or enlarged lymph nodes.       Objective:   Physical Exam Gen:  Alert, cooperative patient who appears stated age in no acute distress.  Vital signs reviewed. HEENT:  [place]/AT.  EOMI, PERRL.  MMM, tonsils non-erythematous, non-edematous.  External ears WNL, Bilateral TM's normal without retraction, redness or bulging.  Neck: No masses or thyromegaly or limitation in range of motion.  No cervical lymphadenopathy. Cardiac:   Regular rate and rhythm without murmur auscultated.  Good S1/S2. Pulm:  Clear to auscultation bilaterally with good air movement.  No wheezes or rales noted.   Abd:  Soft/nondistended/nontender.  Good bowel sounds throughout all four quadrants.  No masses noted.  Ext:  No clubbing/cyanosis/erythema.  No edema noted bilateral lower extremities.   Skin - no sores or suspicious lesions or rashes or color changes       Assessment & Plan:

## 2010-12-29 NOTE — Assessment & Plan Note (Signed)
Patient with multiple episodes of HTN on past visits.  Repeat blood pressure today elevated.  Added HTN to history.  However, patient declines any medication.  States she will control this at home with OTC herbal supplements.  Long discussion with her about this.  Will continue to encourage HTN treatment at future visits.

## 2010-12-29 NOTE — Assessment & Plan Note (Signed)
Ditropan.   Would limit anti-cholinergic medication over the long run.  She is to follow up in 2-3 months for improvement.

## 2010-12-29 NOTE — Assessment & Plan Note (Signed)
Patient took herself off PPI Has had no further recurrences of chest pain, reflux, or abdominal pain.   Will continue to follow.

## 2011-02-17 LAB — CBC
HCT: 27.5 — ABNORMAL LOW
HCT: 38.6
Hemoglobin: 10.4 — ABNORMAL LOW
Hemoglobin: 13.4
Hemoglobin: 9.1 — ABNORMAL LOW
MCHC: 34.7
MCHC: 34.8
MCV: 93.5
MCV: 93.6
MCV: 93.7
Platelets: 196
Platelets: 245
RBC: 2.82 — ABNORMAL LOW
RDW: 15.1
RDW: 15.9 — ABNORMAL HIGH
WBC: 12.5 — ABNORMAL HIGH
WBC: 14.7 — ABNORMAL HIGH

## 2011-02-17 LAB — PROTIME-INR
INR: 1.1
INR: 1.6 — ABNORMAL HIGH
Prothrombin Time: 14.7
Prothrombin Time: 17.6 — ABNORMAL HIGH

## 2011-02-17 LAB — DIFFERENTIAL
Monocytes Absolute: 0.7
Neutro Abs: 6.5
Neutrophils Relative %: 69

## 2011-02-17 LAB — URINE MICROSCOPIC-ADD ON

## 2011-02-17 LAB — ABO/RH: ABO/RH(D): O POS

## 2011-02-17 LAB — BASIC METABOLIC PANEL
BUN: 19
BUN: 5 — ABNORMAL LOW
CO2: 27
CO2: 29
Calcium: 8.5
Chloride: 105
Creatinine, Ser: 0.66
Creatinine, Ser: 0.73
GFR calc non Af Amer: >60
GFR calc non Af Amer: >60
Glucose, Bld: 142 — ABNORMAL HIGH
Potassium: 3.3 — ABNORMAL LOW
Sodium: 141

## 2011-02-17 LAB — URINALYSIS, ROUTINE W REFLEX MICROSCOPIC
Glucose, UA: NEGATIVE
Hgb urine dipstick: NEGATIVE
Nitrite: NEGATIVE
Specific Gravity, Urine: 1.029
Urobilinogen, UA: 0.2

## 2011-02-17 LAB — APTT: aPTT: 32

## 2011-02-17 LAB — TYPE AND SCREEN: Antibody Screen: NEGATIVE

## 2011-02-18 ENCOUNTER — Telehealth: Payer: Self-pay | Admitting: *Deleted

## 2011-02-18 NOTE — Telephone Encounter (Signed)
Patient requesting a female provider.   Says that she likes Dr. Gwendolyn Grant but feels like a female provider would better understand her.  Have reassigned her to Dr. Tye Savoy.

## 2011-04-20 ENCOUNTER — Encounter: Payer: Self-pay | Admitting: Home Health Services

## 2011-10-04 ENCOUNTER — Other Ambulatory Visit: Payer: Self-pay | Admitting: Orthopedic Surgery

## 2011-10-18 ENCOUNTER — Encounter (HOSPITAL_COMMUNITY): Payer: Self-pay | Admitting: Pharmacy Technician

## 2011-10-19 ENCOUNTER — Encounter (HOSPITAL_COMMUNITY): Payer: Self-pay

## 2011-10-19 ENCOUNTER — Inpatient Hospital Stay (HOSPITAL_COMMUNITY): Admission: RE | Admit: 2011-10-19 | Discharge: 2011-10-19 | Payer: Medicare Other | Source: Ambulatory Visit

## 2011-10-19 HISTORY — DX: Unspecified osteoarthritis, unspecified site: M19.90

## 2011-10-19 HISTORY — DX: Other seasonal allergic rhinitis: J30.2

## 2011-10-19 NOTE — Pre-Procedure Instructions (Signed)
22 Wanda Conner  10/19/2011   Your procedure is scheduled on:  Monday June 10  Report to [facility] at 10:45 AM.  Call this number if you have problems the morning of surgery: [phone]   Remember:   Do not eat food:After Midnight.  May have clear liquids: up to 4 Hours before arrival.  Clear liquids include soda, tea, black coffee, apple or grape juice, broth.  Take these medicines the morning of surgery with A SIP OF WATER: none   Do not wear jewelry, make-up or nail polish.  Do not wear lotions, powders, or perfumes. You may wear deodorant.  Do not shave 48 hours prior to surgery. Men may shave face and neck.  Do not bring valuables to the hospital.  Contacts, dentures or bridgework may not be worn into surgery.  Leave suitcase in the car. After surgery it may be brought to your room.  For patients admitted to the hospital, checkout time is 11:00 AM the day of discharge.   Patients discharged the day of surgery will not be allowed to drive home.  Name and phone number of your driver: NA  Special Instructions: Incentive Spirometry - Practice and bring it with you on the day of surgery. and CHG Shower Use Special Wash: 1/2 bottle night before surgery and 1/2 bottle morning of surgery.   Please read over the following fact sheets that you were given: Pain Booklet, Coughing and Deep Breathing, Blood Transfusion Information, Total Joint Packet and Surgical Site Infection Prevention

## 2011-10-25 ENCOUNTER — Ambulatory Visit (HOSPITAL_COMMUNITY)
Admission: RE | Admit: 2011-10-25 | Discharge: 2011-10-25 | Disposition: A | Discharge status: Home / Self Care | Payer: Medicare Other | Source: Ambulatory Visit | Attending: Orthopedic Surgery | Admitting: Orthopedic Surgery

## 2011-10-25 ENCOUNTER — Encounter (HOSPITAL_COMMUNITY)
Admission: RE | Admit: 2011-10-25 | Discharge: 2011-10-25 | Disposition: A | Discharge status: Home / Self Care | Payer: Medicare Other | Source: Ambulatory Visit | Attending: Orthopedic Surgery | Admitting: Orthopedic Surgery

## 2011-10-25 DIAGNOSIS — Z01810 Encounter for preprocedural cardiovascular examination: Secondary | ICD-10-CM | POA: Insufficient documentation

## 2011-10-25 DIAGNOSIS — Z01818 Encounter for other preprocedural examination: Secondary | ICD-10-CM | POA: Insufficient documentation

## 2011-10-25 DIAGNOSIS — Z01812 Encounter for preprocedural laboratory examination: Secondary | ICD-10-CM | POA: Insufficient documentation

## 2011-10-25 LAB — URINE MICROSCOPIC-ADD ON

## 2011-10-25 LAB — URINALYSIS, ROUTINE W REFLEX MICROSCOPIC
Glucose, UA: NEGATIVE mg/dL
pH: 7 (ref 5.0–8.0)

## 2011-10-25 LAB — DIFFERENTIAL
Basophils Absolute: 0 10*3/uL (ref 0.0–0.1)
Basophils Relative: 0 % (ref 0–1)
Eosinophils Absolute: 0 10*3/uL (ref 0.0–0.7)
Monocytes Absolute: 0.4 10*3/uL (ref 0.1–1.0)
Monocytes Relative: 4 % (ref 3–12)
Neutrophils Relative %: 70 % (ref 43–77)

## 2011-10-25 LAB — CBC
HCT: 41 % (ref 36.0–46.0)
Hemoglobin: 13.6 g/dL (ref 12.0–15.0)
MCH: 33.2 pg (ref 26.0–34.0)
MCHC: 33.2 g/dL (ref 30.0–36.0)
RDW: 13.1 % (ref 11.5–15.5)

## 2011-10-25 LAB — BASIC METABOLIC PANEL
BUN: 13 mg/dL (ref 6–23)
Creatinine, Ser: 0.6 mg/dL (ref 0.50–1.10)
GFR calc non Af Amer: 86 mL/min — ABNORMAL LOW (ref >90)
Glucose, Bld: 99 mg/dL (ref 70–99)
Potassium: 3.8 mEq/L (ref 3.5–5.1)

## 2011-10-25 LAB — SURGICAL PCR SCREEN
MRSA, PCR: NEGATIVE
Staphylococcus aureus: POSITIVE — AB

## 2011-10-25 MED ORDER — CHLORHEXIDINE GLUCONATE 4 % EX LIQD: 60.0000 mL | Freq: Once | CUTANEOUS | Status: DC

## 2011-10-27 NOTE — H&P (Signed)
  Subjective: Patient reports that the Supartz injections into her left knee provided only temporary relief her pain is now severe and she is pretty much resign herself a left knee replacement in hopes that he gives her the same level of the relief that the right knee replacement did back in 2009.  Her pain wakes her up at night, limits her ambulation, and she is almost fallen a number of times.  X-rays from a few years ago showed her to be bone-on-bone to the medial compartment.  PAST MEDICAL HX: s/p breast cyst removal, s/p right TKA.  PUD, Asthma.   Family history--significant for diabetes, hypertension, heart disease, and arthritis.   Social history--does not use tobacco or ethanol. She is married and lives with her husband. She is not currently working.   ROS: Patient denies dizziness, nausea, fever, chills, vomiting, shortness of breath, chest pain, loss of appetite, or rash.    PHYSICAL EXAM: Well-developed, well-nourished.  Awake, alert, and oriented x3.  Extraocular motion is intact.  No use of accessory respiratory muscles for breathing.   Cardiovascular exam reveals a regular rhythm.  Skin is intact without cuts, scrapes, or abrasions. The skin over the left knee is intact.  One plus effusion crepitus as you take her through a range of motion very tender along the medial joint line.  Obvious varus deformity to the left knee.  The right total knee wound is healed there is no effusion range of motion is 0-105 and she is neurovascularly intact to both lower extremities.  Asses: Well-placed well fixed and stable right total knee at 4 years, end-stage arthritis left knee with varus deformity that is increasing bone-on-bone with crepitus.  Plan: We will get her set up for left total knee arthroplasty wrist benefits are once again reviewed she'll discuss her options regarding scheduling with Olegario Messier and I will see her back at the time of surgery.

## 2011-10-28 NOTE — Progress Notes (Signed)
Spoke with the patient.. She will arrive at 0915 and stop all liquids at 0515 the am of surgery  Due to surgery  Moved up to 1115

## 2011-10-30 MED ORDER — CEFAZOLIN SODIUM-DEXTROSE 2-3 GM-% IV SOLR
2.0000 g | INTRAVENOUS | Status: DC
  Filled 2011-10-30: qty 50

## 2011-10-31 ENCOUNTER — Inpatient Hospital Stay (HOSPITAL_COMMUNITY)
Admission: RE | Admit: 2011-10-31 | Discharge: 2011-11-03 | DRG: 470 | Disposition: A | Discharge status: Home Health | Payer: Medicare Other | Source: Ambulatory Visit | Attending: Orthopedic Surgery | Admitting: Orthopedic Surgery

## 2011-10-31 ENCOUNTER — Ambulatory Visit (HOSPITAL_COMMUNITY): Payer: Medicare Other | Admitting: *Deleted

## 2011-10-31 ENCOUNTER — Encounter (HOSPITAL_COMMUNITY): Payer: Self-pay | Admitting: *Deleted

## 2011-10-31 ENCOUNTER — Encounter (HOSPITAL_COMMUNITY)
Admission: RE | Disposition: A | Discharge status: Home Health | Payer: Self-pay | Source: Ambulatory Visit | Attending: Orthopedic Surgery

## 2011-10-31 DIAGNOSIS — M1712 Unilateral primary osteoarthritis, left knee: Secondary | ICD-10-CM | POA: Diagnosis present

## 2011-10-31 DIAGNOSIS — Z96659 Presence of unspecified artificial knee joint: Secondary | ICD-10-CM

## 2011-10-31 DIAGNOSIS — D62 Acute posthemorrhagic anemia: Secondary | ICD-10-CM | POA: Diagnosis not present

## 2011-10-31 DIAGNOSIS — M1710 Unilateral primary osteoarthritis, unspecified knee: Principal | ICD-10-CM | POA: Diagnosis present

## 2011-10-31 HISTORY — DX: Unilateral primary osteoarthritis, left knee: M17.12

## 2011-10-31 HISTORY — PX: TOTAL KNEE ARTHROPLASTY: SHX125

## 2011-10-31 SURGERY — ARTHROPLASTY, KNEE, TOTAL
Anesthesia: General | Site: Knee | Laterality: Left | Wound class: Clean

## 2011-10-31 MED ORDER — HYDROMORPHONE HCL PF 1 MG/ML IJ SOLN
0.2500 mg | INTRAMUSCULAR | Status: DC | PRN
  Administered 2011-10-31 (×3): 0.5 mg via INTRAVENOUS

## 2011-10-31 MED ORDER — FENTANYL CITRATE 0.05 MG/ML IJ SOLN
50.0000 ug | INTRAMUSCULAR | Status: DC | PRN
  Administered 2011-10-31: 100 ug via INTRAVENOUS

## 2011-10-31 MED ORDER — ONDANSETRON HCL 4 MG/2ML IJ SOLN
INTRAMUSCULAR | Status: DC | PRN
  Administered 2011-10-31: 4 mg via INTRAVENOUS

## 2011-10-31 MED ORDER — SODIUM CHLORIDE 0.9 % IR SOLN
Status: DC | PRN
  Administered 2011-10-31: 3000 mL

## 2011-10-31 MED ORDER — KCL IN DEXTROSE-NACL 20-5-0.45 MEQ/L-%-% IV SOLN
INTRAVENOUS | Status: DC
  Administered 2011-10-31: 17:00:00 via INTRAVENOUS
  Administered 2011-11-01: 125 mL/h via INTRAVENOUS
  Filled 2011-10-31 (×11): qty 1000

## 2011-10-31 MED ORDER — ZOLPIDEM TARTRATE 5 MG PO TABS: 5.0000 mg | ORAL_TABLET | Freq: Every evening | ORAL | Status: DC | PRN

## 2011-10-31 MED ORDER — WARFARIN - PHARMACIST DOSING INPATIENT: Freq: Every day | Status: DC

## 2011-10-31 MED ORDER — ONDANSETRON HCL 4 MG/2ML IJ SOLN
4.0000 mg | Freq: Four times a day (QID) | INTRAMUSCULAR | Status: DC | PRN
  Administered 2011-10-31: 4 mg via INTRAVENOUS

## 2011-10-31 MED ORDER — METHOCARBAMOL 500 MG PO TABS
500.0000 mg | ORAL_TABLET | Freq: Four times a day (QID) | ORAL | Status: DC | PRN
  Administered 2011-10-31 – 2011-11-02 (×4): 500 mg via ORAL
  Filled 2011-10-31 (×5): qty 1

## 2011-10-31 MED ORDER — PATIENT'S GUIDE TO USING COUMADIN BOOK
Freq: Once | Status: AC
  Administered 2011-10-31: 22:00:00
  Filled 2011-10-31: qty 1

## 2011-10-31 MED ORDER — FLEET ENEMA 7-19 GM/118ML RE ENEM: 1.0000 | ENEMA | Freq: Once | RECTAL | Status: AC | PRN

## 2011-10-31 MED ORDER — MENTHOL 3 MG MT LOZG: 1.0000 | LOZENGE | OROMUCOSAL | Status: DC | PRN

## 2011-10-31 MED ORDER — ONDANSETRON HCL 4 MG/2ML IJ SOLN
INTRAMUSCULAR | Status: AC
  Administered 2011-10-31: 4 mg via INTRAVENOUS
  Filled 2011-10-31: qty 2

## 2011-10-31 MED ORDER — WARFARIN VIDEO
Freq: Once | Status: AC
  Administered 2011-10-31: 22:00:00

## 2011-10-31 MED ORDER — ACETAMINOPHEN 325 MG PO TABS
650.0000 mg | ORAL_TABLET | Freq: Four times a day (QID) | ORAL | Status: DC | PRN
  Administered 2011-11-01: 650 mg via ORAL
  Filled 2011-10-31: qty 2

## 2011-10-31 MED ORDER — FENTANYL CITRATE 0.05 MG/ML IJ SOLN
INTRAMUSCULAR | Status: DC | PRN
  Administered 2011-10-31: 25 ug via INTRAVENOUS
  Administered 2011-10-31: 50 ug via INTRAVENOUS

## 2011-10-31 MED ORDER — OXYCODONE-ACETAMINOPHEN 5-325 MG PO TABS
1.0000 | ORAL_TABLET | ORAL | Status: DC | PRN
  Administered 2011-10-31: 2 via ORAL
  Filled 2011-10-31: qty 2

## 2011-10-31 MED ORDER — BUPIVACAINE-EPINEPHRINE PF 0.5-1:200000 % IJ SOLN
INTRAMUSCULAR | Status: DC | PRN
  Administered 2011-10-31: 20 mL

## 2011-10-31 MED ORDER — LIDOCAINE HCL (CARDIAC) 20 MG/ML IV SOLN
INTRAVENOUS | Status: DC | PRN
  Administered 2011-10-31: 80 mg via INTRAVENOUS

## 2011-10-31 MED ORDER — ACETAMINOPHEN 10 MG/ML IV SOLN
INTRAVENOUS | Status: DC | PRN
  Administered 2011-10-31: 1000 mg via INTRAVENOUS

## 2011-10-31 MED ORDER — ONDANSETRON HCL 4 MG/2ML IJ SOLN: 4.0000 mg | Freq: Once | INTRAMUSCULAR | Status: DC | PRN

## 2011-10-31 MED ORDER — WARFARIN SODIUM 5 MG PO TABS
5.0000 mg | ORAL_TABLET | Freq: Once | ORAL | Status: AC
  Administered 2011-10-31: 5 mg via ORAL
  Filled 2011-10-31: qty 1

## 2011-10-31 MED ORDER — MAGNESIUM HYDROXIDE 400 MG/5ML PO SUSP: 30.0000 mL | Freq: Every day | ORAL | Status: DC | PRN

## 2011-10-31 MED ORDER — HYDROMORPHONE HCL PF 1 MG/ML IJ SOLN
INTRAMUSCULAR | Status: AC
  Filled 2011-10-31: qty 1

## 2011-10-31 MED ORDER — ACETAMINOPHEN 10 MG/ML IV SOLN
INTRAVENOUS | Status: AC
  Filled 2011-10-31: qty 100

## 2011-10-31 MED ORDER — PROPOFOL 10 MG/ML IV EMUL
INTRAVENOUS | Status: DC | PRN
  Administered 2011-10-31: 200 mg via INTRAVENOUS

## 2011-10-31 MED ORDER — LACTATED RINGERS IV SOLN
INTRAVENOUS | Status: DC | PRN
  Administered 2011-10-31 (×2): via INTRAVENOUS

## 2011-10-31 MED ORDER — ONDANSETRON HCL 4 MG PO TABS
4.0000 mg | ORAL_TABLET | Freq: Four times a day (QID) | ORAL | Status: DC | PRN
  Administered 2011-11-03: 4 mg via ORAL
  Filled 2011-10-31: qty 1

## 2011-10-31 MED ORDER — METOCLOPRAMIDE HCL 10 MG PO TABS: 5.0000 mg | ORAL_TABLET | Freq: Three times a day (TID) | ORAL | Status: DC | PRN

## 2011-10-31 MED ORDER — FENTANYL CITRATE 0.05 MG/ML IJ SOLN
INTRAMUSCULAR | Status: AC
  Filled 2011-10-31: qty 2

## 2011-10-31 MED ORDER — ACETAMINOPHEN 650 MG RE SUPP: 650.0000 mg | Freq: Four times a day (QID) | RECTAL | Status: DC | PRN

## 2011-10-31 MED ORDER — METOCLOPRAMIDE HCL 5 MG/ML IJ SOLN: 5.0000 mg | Freq: Three times a day (TID) | INTRAMUSCULAR | Status: DC | PRN

## 2011-10-31 MED ORDER — DIPHENHYDRAMINE HCL 12.5 MG/5ML PO ELIX: 12.5000 mg | ORAL_SOLUTION | ORAL | Status: DC | PRN

## 2011-10-31 MED ORDER — BISACODYL 5 MG PO TBEC: 5.0000 mg | DELAYED_RELEASE_TABLET | Freq: Every day | ORAL | Status: DC | PRN

## 2011-10-31 MED ORDER — METHOCARBAMOL 100 MG/ML IJ SOLN
500.0000 mg | Freq: Four times a day (QID) | INTRAVENOUS | Status: DC | PRN
  Filled 2011-10-31: qty 5

## 2011-10-31 MED ORDER — HYDROMORPHONE HCL PF 1 MG/ML IJ SOLN
0.5000 mg | INTRAMUSCULAR | Status: DC | PRN
  Administered 2011-10-31 – 2011-11-01 (×3): 1 mg via INTRAVENOUS
  Filled 2011-10-31 (×3): qty 1

## 2011-10-31 MED ORDER — HYDROMORPHONE HCL PF 1 MG/ML IJ SOLN
INTRAMUSCULAR | Status: AC
  Administered 2011-10-31: 1 mg
  Filled 2011-10-31: qty 1

## 2011-10-31 MED ORDER — PHENOL 1.4 % MT LIQD: 1.0000 | OROMUCOSAL | Status: DC | PRN

## 2011-10-31 MED ORDER — LACTATED RINGERS IV SOLN
INTRAVENOUS | Status: DC
  Administered 2011-10-31: 09:00:00 via INTRAVENOUS

## 2011-10-31 MED ORDER — ENOXAPARIN SODIUM 30 MG/0.3ML ~~LOC~~ SOLN
30.0000 mg | Freq: Two times a day (BID) | SUBCUTANEOUS | Status: DC
  Administered 2011-10-31 – 2011-11-01 (×3): 30 mg via SUBCUTANEOUS
  Filled 2011-10-31 (×6): qty 0.3

## 2011-10-31 MED ORDER — ASPIRIN EC 325 MG PO TBEC
325.0000 mg | DELAYED_RELEASE_TABLET | Freq: Two times a day (BID) | ORAL | Status: DC
  Administered 2011-10-31: 325 mg via ORAL
  Filled 2011-10-31 (×2): qty 1

## 2011-10-31 MED ORDER — CEFUROXIME SODIUM 1.5 G IJ SOLR
INTRAMUSCULAR | Status: DC | PRN
  Administered 2011-10-31: 1.5 g

## 2011-10-31 MED ORDER — CEFAZOLIN SODIUM 1-5 GM-% IV SOLN
INTRAVENOUS | Status: DC | PRN
  Administered 2011-10-31: 1 g via INTRAVENOUS

## 2011-10-31 MED ORDER — ALUM & MAG HYDROXIDE-SIMETH 200-200-20 MG/5ML PO SUSP: 30.0000 mL | ORAL | Status: DC | PRN

## 2011-10-31 MED ORDER — CELECOXIB 200 MG PO CAPS
200.0000 mg | ORAL_CAPSULE | Freq: Two times a day (BID) | ORAL | Status: DC
  Administered 2011-10-31 – 2011-11-03 (×6): 200 mg via ORAL
  Filled 2011-10-31 (×10): qty 1

## 2011-10-31 MED ORDER — OXYCODONE HCL 5 MG PO TABS
5.0000 mg | ORAL_TABLET | ORAL | Status: DC | PRN
  Administered 2011-11-01: 10 mg via ORAL
  Administered 2011-11-01: 5 mg via ORAL
  Administered 2011-11-02 – 2011-11-03 (×4): 10 mg via ORAL
  Filled 2011-10-31: qty 2
  Filled 2011-10-31: qty 1
  Filled 2011-10-31 (×4): qty 2

## 2011-10-31 MED ORDER — POTASSIUM GLUCONATE 595 (99 K) MG PO TABS: 595.0000 mg | ORAL_TABLET | Freq: Every day | ORAL | Status: DC

## 2011-10-31 SURGICAL SUPPLY — 56 items
BANDAGE ELASTIC 6 VELCRO ST LF (GAUZE/BANDAGES/DRESSINGS) ×2 IMPLANT
BANDAGE ESMARK 6X9 LF (GAUZE/BANDAGES/DRESSINGS) ×1 IMPLANT
BLADE SAG 18X100X1.27 (BLADE) ×2 IMPLANT
BLADE SAW SGTL 13X75X1.27 (BLADE) ×2 IMPLANT
BLADE SURG ROTATE 9660 (MISCELLANEOUS) IMPLANT
BNDG CMPR 9X6 STRL LF SNTH (GAUZE/BANDAGES/DRESSINGS) ×1
BNDG CMPR MED 10X6 ELC LF (GAUZE/BANDAGES/DRESSINGS) ×1
BNDG ELASTIC 6X10 VLCR STRL LF (GAUZE/BANDAGES/DRESSINGS) ×2 IMPLANT
BNDG ESMARK 6X9 LF (GAUZE/BANDAGES/DRESSINGS) ×2
BOWL SMART MIX CTS (DISPOSABLE) ×2 IMPLANT
CEMENT HV SMART SET (Cement) ×4 IMPLANT
CLOTH BEACON ORANGE TIMEOUT ST (SAFETY) ×2 IMPLANT
COVER BACK TABLE 24X17X13 BIG (DRAPES) IMPLANT
COVER SURGICAL LIGHT HANDLE (MISCELLANEOUS) ×4 IMPLANT
CUFF TOURNIQUET SINGLE 34IN LL (TOURNIQUET CUFF) ×2 IMPLANT
CUFF TOURNIQUET SINGLE 44IN (TOURNIQUET CUFF) IMPLANT
DRAPE EXTREMITY T 121X128X90 (DRAPE) ×2 IMPLANT
DRAPE U-SHAPE 47X51 STRL (DRAPES) ×2 IMPLANT
DURAPREP 26ML APPLICATOR (WOUND CARE) ×2 IMPLANT
ELECT REM PT RETURN 9FT ADLT (ELECTROSURGICAL) ×2
ELECTRODE REM PT RTRN 9FT ADLT (ELECTROSURGICAL) ×1 IMPLANT
EVACUATOR 1/8 PVC DRAIN (DRAIN) ×2 IMPLANT
GAUZE XEROFORM 1X8 LF (GAUZE/BANDAGES/DRESSINGS) ×2 IMPLANT
GLOVE BIO SURGEON STRL SZ7 (GLOVE) ×2 IMPLANT
GLOVE BIO SURGEON STRL SZ7.5 (GLOVE) ×2 IMPLANT
GLOVE BIOGEL PI IND STRL 7.0 (GLOVE) ×1 IMPLANT
GLOVE BIOGEL PI IND STRL 8 (GLOVE) ×1 IMPLANT
GLOVE BIOGEL PI INDICATOR 7.0 (GLOVE) ×1
GLOVE BIOGEL PI INDICATOR 8 (GLOVE) ×1
GOWN PREVENTION PLUS XLARGE (GOWN DISPOSABLE) ×2 IMPLANT
GOWN STRL NON-REIN LRG LVL3 (GOWN DISPOSABLE) ×6 IMPLANT
HANDPIECE INTERPULSE COAX TIP (DISPOSABLE) ×2
HOOD PEEL AWAY FACE SHEILD DIS (HOOD) ×6 IMPLANT
KIT BASIN OR (CUSTOM PROCEDURE TRAY) ×2 IMPLANT
KIT ROOM TURNOVER OR (KITS) ×2 IMPLANT
MANIFOLD NEPTUNE II (INSTRUMENTS) ×2 IMPLANT
NS IRRIG 1000ML POUR BTL (IV SOLUTION) ×2 IMPLANT
PACK TOTAL JOINT (CUSTOM PROCEDURE TRAY) ×2 IMPLANT
PAD ARMBOARD 7.5X6 YLW CONV (MISCELLANEOUS) ×4 IMPLANT
PADDING CAST ABS 6INX4YD NS (CAST SUPPLIES) ×1
PADDING CAST ABS COTTON 6X4 NS (CAST SUPPLIES) ×1 IMPLANT
PADDING CAST COTTON 6X4 STRL (CAST SUPPLIES) ×2 IMPLANT
SET HNDPC FAN SPRY TIP SCT (DISPOSABLE) ×1 IMPLANT
SPONGE GAUZE 4X4 12PLY (GAUZE/BANDAGES/DRESSINGS) ×2 IMPLANT
STAPLER VISISTAT 35W (STAPLE) ×2 IMPLANT
SUCTION FRAZIER TIP 10 FR DISP (SUCTIONS) ×2 IMPLANT
SUT VIC AB 0 CTX 36 (SUTURE) ×2
SUT VIC AB 0 CTX36XBRD ANTBCTR (SUTURE) ×1 IMPLANT
SUT VIC AB 1 CTX 36 (SUTURE) ×2
SUT VIC AB 1 CTX36XBRD ANBCTR (SUTURE) ×1 IMPLANT
SUT VIC AB 2-0 CT1 27 (SUTURE) ×2
SUT VIC AB 2-0 CT1 TAPERPNT 27 (SUTURE) ×1 IMPLANT
TOWEL OR 17X24 6PK STRL BLUE (TOWEL DISPOSABLE) ×2 IMPLANT
TOWEL OR 17X26 10 PK STRL BLUE (TOWEL DISPOSABLE) ×2 IMPLANT
TRAY FOLEY CATH 14FR (SET/KITS/TRAYS/PACK) ×2 IMPLANT
WATER STERILE IRR 1000ML POUR (IV SOLUTION) ×2 IMPLANT

## 2011-10-31 NOTE — Interval H&P Note (Signed)
History and Physical Interval Note:  10/31/2011 9:16 AM  [name]  has presented today for surgery, with the diagnosis of OSTEOARTHRITIS LEFT KNEE  The various methods of treatment have been discussed with the patient and family. After consideration of risks, benefits and other options for treatment, the patient has consented to  Procedure(s) (LRB): TOTAL KNEE ARTHROPLASTY (Left) as a surgical intervention .  The patients' history has been reviewed, patient examined, no change in status, stable for surgery.  I have reviewed the patients' chart and labs.  Questions were answered to the patient's satisfaction.     Wanda Conner

## 2011-10-31 NOTE — Progress Notes (Signed)
PHARMACIST - PHYSICIAN ORDER COMMUNICATION  CONCERNING: P&T Medication Policy on Herbal Medications  DESCRIPTION:  This patient's order for: potassium gluconate  has been noted.  This product(s) is classified as an "herbal" or natural product. Due to a lack of definitive safety studies or FDA approval, nonstandard manufacturing practices, plus the potential risk of unknown drug-drug interactions while on inpatient medications, the Pharmacy and Therapeutics Committee does not permit the use of "herbal" or natural products of this type within [facility]   ACTION TAKEN: The pharmacy department is unable to verify this order at this time and your patient has been informed of this safety policy. Please reevaluate patient's clinical condition at discharge and address if the herbal or natural product(s) should be resumed at that time.     Wendie Simmer, PharmD, BCPS Clinical Pharmacist

## 2011-10-31 NOTE — Progress Notes (Signed)
UR COMPLETED  

## 2011-10-31 NOTE — Progress Notes (Signed)
Pt vomited aspirin after first dose this evening.  Reports intolerance to aspirin.  Dr. Yevette Edwards on-call and notified.  Orders received.

## 2011-10-31 NOTE — Anesthesia Procedure Notes (Signed)
Anesthesia Regional Block:  Femoral nerve block  Pre-Anesthetic Checklist: ,, timeout performed, Correct Patient, Correct Site, Correct Laterality, Correct Procedure, Correct Position, site marked, Risks and benefits discussed,  Surgical consent,  Pre-op evaluation,  At surgeon's request and post-op pain management  Laterality: Left  Prep: Maximum Sterile Barrier Precautions used, chloraprep and alcohol swabs       Needles:  Injection technique: Single-shot  Needle Type: Stimulator Needle - 80        Needle insertion depth: 6 cm   Additional Needles:  Procedures: nerve stimulator Femoral nerve block  Nerve Stimulator or Paresthesia:  Response: 0.5 mA, 0.1 ms, 6 cm  Additional Responses:   Narrative:  Start time: 10/31/2011 8:40 AM End time: 10/31/2011 8:47 AM Injection made incrementally with aspirations every 5 mL.  Performed by: Personally  Anesthesiologist: Maren Beach MD  Additional Notes: 20cc 0.5% Marcaine w/epi w/o difficulty or discomfort. GES

## 2011-10-31 NOTE — Progress Notes (Addendum)
ANTICOAGULATION CONSULT NOTE - Initial Consult  Pharmacy Consult for coumadin Indication: VTE prophylaxis  Allergies  Allergen Reactions  . Omeprazole Other (See Comments)    "did me worse than it did good". Pt does not like this medication to treat her stomach symtoms   Vital Signs: Temp: 97.2 F (36.2 C) (06/10 1337) BP: 125/80 mmHg (06/10 1337) Pulse Rate: 77  (06/10 1337)  Labs: No results found for this basename: HGB:2,HCT:3,PLT:3,APTT:3,LABPROT:3,INR:3,HEPARINUNFRC:3,CREATININE:3,CKTOTAL:3,CKMB:3,TROPONINI:3 in the last 72 hours  The CrCl is unknown because both a height and weight (above a minimum accepted value) are required for this calculation.   Medical History: Past Medical History  Diagnosis Date  . Depression   . GERD (gastroesophageal reflux disease)   . Anemia     History of this  . Seasonal allergies   . Arthritis     Medications:  Prescriptions prior to admission  Medication Sig Dispense Refill  . Calcium Carbonate-Vitamin D (CALTRATE 600+D) 600-400 MG-UNIT per tablet Take 1 tablet by mouth 2 (two) times daily.       . Ferrous Gluconate (IRON) 240 (27 FE) MG TABS Take 0.5 tablets by mouth daily.      Marland Kitchen ibuprofen (ADVIL,MOTRIN) 600 MG tablet Take 600 mg by mouth 3 (three) times daily as needed.      . Multiple Vitamins-Minerals (CENTRUM SILVER ULTRA WOMENS) TABS Take 1 tablet by mouth daily.       . Omega-3 Fatty Acids (FISH OIL PO) Take 1 capsule by mouth every morning.      . Potassium Gluconate 595 MG TABS Take 595 mg by mouth daily.         Assessment: 78 yo who is s/p TKR. Coumadin for DVT prophylaxis.   Goal of Therapy:  INR 1.5-2 Monitor platelets by anticoagulation protocol: Yes   Plan:  1. Coumadin 5mg  PO x1 2. Daily PT/INR 3. Coumadin teaching book/video  Ulyses Southward Wilcox 10/31/2011,8:11 PM

## 2011-10-31 NOTE — Plan of Care (Signed)
Problem: Consults Goal: Diagnosis- Total Joint Replacement Outcome: Completed/Met Date Met:  10/31/11 Primary Total Knee

## 2011-10-31 NOTE — Op Note (Signed)
PATIENT ID:      Wanda Conner  MRN:     [id number] DOB/AGE:    [birth year] / 78 y.o.       OPERATIVE REPORT    DATE OF PROCEDURE:  10/31/2011       PREOPERATIVE DIAGNOSIS:   OSTEOARTHRITIS LEFT KNEE      Estimated Body mass index is 24.68 kg/(m^2) as calculated from the following:   Height as of 12/28/10: 5\' 4" (1.626 m).   Weight as of 12/28/10: 143 lb 12.8 oz(65.227 kg).                                                        POSTOPERATIVE DIAGNOSIS:   OSTEOARTHRITIS LEFT KNEE                                                                      PROCEDURE:  Procedure(s): TOTAL KNEE ARTHROPLASTY Using Depuy Sigma RP implants #3L Femur, #3 Tibia, 10mm sigma RP bearing, 38 Patella     SURGEON: Brant Peets J    ASSISTANT:   [name] PA-C   (Present and scrubbed throughout the case, critical for assistance with exposure, retraction, instrumentation, and closure.)         ANESTHESIA: GET with Femoral Nerve Block  DRAINS: foley, 2 medium hemovac in knee   TOURNIQUET TIME:   COMPLICATIONS:  None     SPECIMENS: None   INDICATIONS FOR PROCEDURE: The patient has  OSTEOARTHRITIS LEFT KNEE, varus deformities, XR shows bone on bone arthritis. Patient has failed all conservative measures including anti-inflammatory medicines, narcotics, attempts at  exercise and weight loss, cortisone injections and viscosupplementation.  Risks and benefits of surgery have been discussed, questions answered.   DESCRIPTION OF PROCEDURE: The patient identified by armband, received  right femoral nerve block and IV antibiotics, in the holding area at [facility]. Patient taken to the operating room, appropriate anesthetic  monitors were attached General endotracheal anesthesia induced with  the patient in supine position, Foley catheter was inserted. Tourniquet  applied high to the operative thigh. Lateral post and foot positioner  applied to the table, the lower extremity was then prepped and  draped  in usual sterile fashion from the ankle to the tourniquet. Time-out procedure was performed. The limb was wrapped with an Esmarch bandage and the tourniquet inflated to 350 mmHg. We began the operation by making the anterior midline incision starting at handbreadth above the patella going over the patella 1 cm medial to and  4 cm distal to the tibial tubercle. Small bleeders in the skin and the  subcutaneous tissue identified and cauterized. Transverse retinaculum was incised and reflected medially and a medial parapatellar arthrotomy was accomplished. the patella was everted and theprepatellar fat pad resected. The superficial medial collateral  ligament was then elevated from anterior to posterior along the proximal  flare of the tibia and anterior half of the menisci resected. The knee was hyperflexed exposing bone on bone arthritis. Peripheral and notch osteophytes as well as the cruciate ligaments were then resected. We continued to  work our way around posteriorly along the proximal tibia, and externally  rotated the tibia subluxing it out from underneath the femur. A McHale  retractor was placed through the notch and a lateral Hohmann retractor  placed, and we then drilled through the proximal tibia in line with the  axis of the tibia followed by an intramedullary guide rod and 2-degree  posterior slope cutting guide. The tibial cutting guide was pinned into place  allowing resection of 4 mm of bone medially and about 8 mm of bone  laterally because of her varus deformity. Satisfied with the tibial resection, we then  entered the distal femur 2 mm anterior to the PCL origin with the  intramedullary guide rod and applied the distal femoral cutting guide  set at 11mm, with 5 degrees of valgus. This was pinned along the  epicondylar axis. At this point, the distal femoral cut was accomplished without difficulty. We then sized for a #3L femoral component and pinned the guide in 3 degrees  of external rotation.The chamfer cutting guide was pinned into place. The anterior, posterior, and chamfer cuts were accomplished without difficulty followed by  the Sigma RP box cutting guide and the box cut. We also removed posterior osteophytes from the posterior femoral condyles. At this  time, the knee was brought into full extension. We checked our  extension and flexion gaps and found them symmetric at 10mm.  The patella thickness measured at 23 mm. We set the cutting guide at 15 and removed the posterior 9.5-10 mm  of the patella sized for 38 button and drilled the lollipop. The knee  was then once again hyperflexed exposing the proximal tibia. We sized for a #3 tibial base plate, applied the smokestack and the conical reamer followed by the the Delta fin keel punch. We then hammered into place the Sigma RP trial femoral component, inserted a 10-mm trial bearing, trial patellar button, and took the knee through range of motion from 0-130 degrees. No thumb pressure was required for patellar  tracking. At this point, all trial components were removed, a double batch of DePuy HV cement with 1500 mg of Zinacef was mixed and applied to all bony metallic mating surfaces except for the posterior condyles of the femur itself. In order, we  hammered into place the tibial tray and removed excess cement, the femoral component and removed excess cement, a 10-mm Sigma RP bearing  was inserted, and the knee brought to full extension with compression.  The patellar button was clamped into place, and excess cement  removed. While the cement cured the wound was irrigated out with normal saline solution pulse lavage, and medium Hemovac drains were placed from an anterolateral  approach. Ligament stability and patellar tracking were checked and found to be excellent. The parapatellar arthrotomy was closed with  running #1 Vicryl suture. The subcutaneous tissue with 0 and 2-0 undyed  Vicryl suture, and the skin  with skin staples. A dressing of Xeroform,  4 x 4, dressing sponges, Webril, and Ace wrap applied. The patient  awakened, extubated, and taken to recovery room without difficulty.   Jahmel Flannagan J 10/31/2011, 11:43 AM

## 2011-10-31 NOTE — Progress Notes (Signed)
Orthopedic Tech Progress Note Patient Details:  Wanda Conner 1933-11-23 [id number]  CPM Left Knee CPM Left Knee: On Left Knee Flexion (Degrees): 30  Left Knee Extension (Degrees): 0  Additional Comments: trapeze bar   Shawnie Pons 10/31/2011, 12:37 PM

## 2011-10-31 NOTE — Preoperative (Signed)
Beta Blockers   Reason not to administer Beta Blockers:Not Applicable 

## 2011-10-31 NOTE — Anesthesia Postprocedure Evaluation (Signed)
  Anesthesia Post-op Note  Patient: Wanda Conner  Procedure(s) Performed: Procedure(s) (LRB): TOTAL KNEE ARTHROPLASTY (Left)  Patient Location: PACU  Anesthesia Type: GA combined with regional for post-op pain  Level of Consciousness: awake, oriented, sedated and patient cooperative  Airway and Oxygen Therapy: Patient Spontanous Breathing and Patient connected to nasal cannula oxygen  Post-op Pain: mild  Post-op Assessment: Post-op Vital signs reviewed, Patient's Cardiovascular Status Stable, Respiratory Function Stable, Patent Airway, No signs of Nausea or vomiting and Pain level controlled  Post-op Vital Signs: stable  Complications: No apparent anesthesia complications

## 2011-10-31 NOTE — Interval H&P Note (Signed)
History and Physical Interval Note:  10/31/2011 9:15 AM  Wanda Conner  has presented today for surgery, with the diagnosis of OSTEOARTHRITIS LEFT KNEE  The various methods of treatment have been discussed with the patient and family. After consideration of risks, benefits and other options for treatment, the patient has consented to  Procedure(s) (LRB): TOTAL KNEE ARTHROPLASTY (Left) as a surgical intervention .  The patients' history has been reviewed, patient examined, no change in status, stable for surgery.  I have reviewed the patients' chart and labs.  Questions were answered to the patient's satisfaction.     Nestor Lewandowsky

## 2011-10-31 NOTE — Anesthesia Preprocedure Evaluation (Signed)
Anesthesia Evaluation  Patient identified by MRN, date of birth, ID band Patient awake    Reviewed: Allergy & Precautions, H&P , NPO status , Patient's Chart, lab work & pertinent test results  Airway Mallampati: I TM Distance: >3 FB Neck ROM: full    Dental   Pulmonary          Cardiovascular Rhythm:regular Rate:Normal     Neuro/Psych    GI/Hepatic GERD-  ,  Endo/Other    Renal/GU      Musculoskeletal   Abdominal   Peds  Hematology   Anesthesia Other Findings   Reproductive/Obstetrics                           Anesthesia Physical Anesthesia Plan  ASA: I  Anesthesia Plan: General   Post-op Pain Management:    Induction: Intravenous  Airway Management Planned: LMA and Oral ETT  Additional Equipment:   Intra-op Plan:   Post-operative Plan: Extubation in OR  Informed Consent: I have reviewed the patients History and Physical, chart, labs and discussed the procedure including the risks, benefits and alternatives for the proposed anesthesia with the patient or authorized representative who has indicated his/her understanding and acceptance.     Plan Discussed with: CRNA, Anesthesiologist and Surgeon  Anesthesia Plan Comments:         Anesthesia Quick Evaluation

## 2011-10-31 NOTE — Transfer of Care (Signed)
Immediate Anesthesia Transfer of Care Note  Patient: Wanda Conner  Procedure(s) Performed: Procedure(s) (LRB): TOTAL KNEE ARTHROPLASTY (Left)  Patient Location: PACU  Anesthesia Type: GA combined with regional for post-op pain  Level of Consciousness: awake and alert   Airway & Oxygen Therapy: Patient Spontanous Breathing and Patient connected to nasal cannula oxygen  Post-op Assessment: Report given to PACU RN, Post -op Vital signs reviewed and stable and Patient moving all extremities  Post vital signs: Reviewed and stable  Complications: No apparent anesthesia complications

## 2011-10-31 NOTE — Progress Notes (Signed)
Pt with 515 cc output from hemovac.  Hemovac clamped at this time for 4 hours.  Continue to monitor.  Dressing CDI.

## 2011-11-01 ENCOUNTER — Encounter (HOSPITAL_COMMUNITY): Payer: Self-pay | Admitting: Orthopedic Surgery

## 2011-11-01 LAB — BASIC METABOLIC PANEL
BUN: 11 mg/dL (ref 6–23)
CO2: 26 mEq/L (ref 19–32)
GFR calc non Af Amer: 89 mL/min — ABNORMAL LOW (ref >90)
Glucose, Bld: 140 mg/dL — ABNORMAL HIGH (ref 70–99)
Potassium: 3.6 mEq/L (ref 3.5–5.1)

## 2011-11-01 LAB — CBC
HCT: 30.6 % — ABNORMAL LOW (ref 36.0–46.0)
Hemoglobin: 10.1 g/dL — ABNORMAL LOW (ref 12.0–15.0)
MCHC: 33 g/dL (ref 30.0–36.0)
RBC: 3.06 MIL/uL — ABNORMAL LOW (ref 3.87–5.11)

## 2011-11-01 LAB — PROTIME-INR: INR: 1.15 (ref 0.00–1.49)

## 2011-11-01 MED ORDER — WARFARIN SODIUM 5 MG PO TABS
5.0000 mg | ORAL_TABLET | Freq: Once | ORAL | Status: AC
  Administered 2011-11-01: 5 mg via ORAL
  Filled 2011-11-01: qty 1

## 2011-11-01 NOTE — Discharge Instructions (Signed)
Home Health to be provided by [facility] [phone] 

## 2011-11-01 NOTE — Progress Notes (Signed)
Physical Therapy Treatment Patient Details Name: Wanda Conner MRN: [id number] DOB: [birth year] Today's Date: 11/01/2011 Time: 1324-4010 PT Time Calculation (min): 27 min  PT Assessment / Plan / Recommendation Comments on Treatment Session  Pt making good progress with PT goals this session.  Increased ambulation distance.  Pt very eager to d/c home & has hopes of d/cing tomorrow.  Pt reports she will have 24 hr assist at home.  Due to progress this session it appears pt is on track to safely d/c home when MD feels appropriate.      Follow Up Recommendations  Home health PT;Supervision/Assistance - 24 hour;Other (comment) (vs ST-SNF if slow to improve)    Barriers to Discharge        Equipment Recommendations  Tub/shower seat;Rolling walker with 5" wheels    Recommendations for Other Services    Frequency 7X/week   Plan Discharge plan remains appropriate    Precautions / Restrictions Precautions Precautions: Knee Restrictions Weight Bearing Restrictions: Yes LLE Weight Bearing: Weight bearing as tolerated   Pertinent Vitals/Pain 4/10 knee.      Mobility  Bed Mobility Bed Mobility: Supine to Sit;Sit to Supine Supine to Sit: 4: Min assist;HOB flat Sit to Supine: HOB flat;3: Mod assist Details for Bed Mobility Assistance: Cues for technique.  (A) for LE management & to lower shoulders/trunk to supine.   Transfers Transfers: Sit to Stand;Stand to Sit Sit to Stand: 3: Mod assist;With upper extremity assist;From bed Sit to Stand: Patient Percentage: 50% Stand to Sit: 4: Min assist;With upper extremity assist;To bed Details for Transfer Assistance: Cues for hand placement & body positioning before sitting Ambulation/Gait Ambulation/Gait Assistance: 4: Min guard Ambulation Distance (Feet): 60 Feet Assistive device: Rolling walker Ambulation/Gait Assistance Details: Cues for sequencing, tall posture, safe RW management, & to stay inside RW for increased support/stability.     Gait Pattern: Step-to pattern Stairs: No Wheelchair Mobility Wheelchair Mobility: No    Exercises Total Joint Exercises Ankle Circles/Pumps: AROM;10 reps Quad Sets: AAROM;Both;10 reps;Seated Heel Slides: AAROM;Left;10 reps   PT Diagnosis: Difficulty walking;Generalized weakness;Acute pain  PT Problem List: Decreased strength;Decreased range of motion;Decreased activity tolerance;Decreased balance;Decreased mobility;Decreased cognition;Decreased knowledge of use of DME;Pain PT Treatment Interventions: DME instruction;Gait training;Functional mobility training;Therapeutic activities;Therapeutic exercise;Patient/family education   PT Goals Acute Rehab PT Goals PT Goal Formulation: With patient Time For Goal Achievement: 11/01/11 Potential to Achieve Goals: Good Pt will go Supine/Side to Sit: Independently;with HOB 0 degrees PT Goal: Supine/Side to Sit - Progress: Progressing toward goal Pt will go Sit to Stand: with modified independence PT Goal: Sit to Stand - Progress: Progressing toward goal Pt will Transfer Bed to Chair/Chair to Bed: with modified independence PT Transfer Goal: Bed to Chair/Chair to Bed - Progress: Goal set today Pt will Ambulate: 51 - 150 feet;with modified independence;with rolling walker PT Goal: Ambulate - Progress: Progressing toward goal Pt will Perform Home Exercise Program: with supervision, verbal cues required/provided PT Goal: Perform Home Exercise Program - Progress: Goal set today  Visit Information  Last PT Received On: 11/01/11 Assistance Needed: +1    Subjective Data  Subjective: I'm going to go home tomorrow Patient Stated Goal: Home Independent   Cognition  Overall Cognitive Status: Appears within functional limits for tasks assessed/performed Arousal/Alertness: Awake/alert Orientation Level: Appears intact for tasks assessed Behavior During Session: [facility] for tasks performed    Balance     End of Session PT - End of Session Equipment  Utilized During Treatment: Gait belt Activity Tolerance: Patient tolerated  treatment well Patient left: in bed;in CPM;with call bell/phone within reach Nurse Communication: Mobility status CPM Left Knee CPM Left Knee: On    Lara Mulch 11/01/2011, 2:25 PM   Verdell Face, PTA [phone] 11/01/2011

## 2011-11-01 NOTE — Progress Notes (Signed)
Patient ID: Wanda Conner, female   DOB: 09-20-1933, 78 y.o.   MRN: [id number] PATIENT ID: Wanda Conner  MRN: [id number]  DOB/AGE:  21-Sep-1933 / 78 y.o.  1 Day Post-Op Procedure(s) (LRB): TOTAL KNEE ARTHROPLASTY (Left)    PROGRESS NOTE Subjective: Patient is alert, oriented, no Nausea, no Vomiting, yes passing gas, no Bowel Movement. Taking PO well. Denies SOB, Chest or Calf Pain. Using Incentive Spirometer, PAS in place. Ambulate wbat today, CPM 0-30 Patient reports pain as 7 on 0-10 scale, could not tolerate ASA, started lovenox bridge.    Objective: Vital signs in last 24 hours: Filed Vitals:   11/01/11 0000 11/01/11 0123 11/01/11 0400 11/01/11 0541  BP:  125/71  115/67  Pulse:  83  88  Temp:  98 F (36.7 C)  99.8 F (37.7 C)  TempSrc:      Resp: 18 18 16 20   Height:      Weight:      SpO2: 100% 100% 100% 99%      Intake/Output from previous day: I/O last 3 completed shifts: In: 1625 [I.V.:1625] Out: 645 [Urine:100; Drains:515; Blood:30]   Intake/Output this shift:     LABORATORY DATA:  Basename 11/01/11 0500  WBC 12.8*  HGB 10.1*  HCT 30.6*  PLT 197  NA 138  K 3.6  CL 101  CO2 26  BUN 11  CREATININE 0.54  GLUCOSE 140*  GLUCAP --  INR 1.15  CALCIUM 8.5    Examination: Neurologically intact ABD soft Neurovascular intact Sensation intact distally Intact pulses distally Dorsiflexion/Plantar flexion intact Incision: no drainage No cellulitis present Compartment soft}  Assessment:   1 Day Post-Op Procedure(s) (LRB): TOTAL KNEE ARTHROPLASTY (Left) ADDITIONAL DIAGNOSIS:    Plan: PT/OT WBAT, CPM 5/hrs day until ROM 0-90 degrees, then D/C CPM DVT Prophylaxis:  SCDx72hrs, lovenox to coumadin  DISCHARGE PLAN: Home DISCHARGE NEEDS: HHPT, HHRN, CPM, Walker and 3-in-1 comode seat     Kjell Brannen J 11/01/2011, 7:12 AM

## 2011-11-01 NOTE — Evaluation (Signed)
Occupational Therapy Evaluation Patient Details Name: Wanda Conner MRN: [id number] DOB: 1933/10/20 Today's Date: 11/01/2011 Time: 8657-8469 OT Time Calculation (min): 38 min  OT Assessment / Plan / Recommendation Clinical Impression  This 78 yo female s/p LTKR presents to acute OT with the problems below thus affecting pt's PLOF. WIll benefit from acute OT to get to an I/Mod I level.    OT Assessment  Patient needs continued OT Services    Follow Up Recommendations  No OT follow up    Barriers to Discharge None    Equipment Recommendations  3 in 1 bedside comode    Recommendations for Other Services    Frequency  Min 2X/week    Precautions / Restrictions Precautions Precautions: Knee Restrictions Weight Bearing Restrictions: Yes LLE Weight Bearing: Weight bearing as tolerated       ADL  Eating/Feeding: Simulated;Independent Where Assessed - Eating/Feeding: Edge of bed Grooming: Performed;Teeth care;Brushing hair;Supervision/safety;Set up Where Assessed - Grooming: Unsupported standing Upper Body Bathing: Simulated;Set up Where Assessed - Upper Body Bathing: Unsupported sitting Lower Body Bathing: Simulated;Minimal assistance Where Assessed - Lower Body Bathing: Unsupported sit to stand Upper Body Dressing: Simulated;Set up Where Assessed - Upper Body Dressing: Unsupported sitting Lower Body Dressing: Simulated;Minimal assistance Where Assessed - Lower Body Dressing: Unsupported sit to stand Toilet Transfer: Simulated;Supervision/safety Toilet Transfer Method: Sit to Barista:  (bed sit to stand, chair stand to sit) Toileting - Clothing Manipulation and Hygiene: Simulated;Independent Where Assessed - Toileting Clothing Manipulation and Hygiene: Standing Equipment Used: Rolling walker    OT Diagnosis: Generalized weakness;Cognitive deficits  OT Problem List: Decreased range of motion;Impaired balance (sitting and/or standing);Decreased  safety awareness;Decreased cognition OT Treatment Interventions: Self-care/ADL training;DME and/or AE instruction;Patient/family education;Balance training   OT Goals Acute Rehab OT Goals OT Goal Formulation: With patient Time For Goal Achievement: 11/04/11 Potential to Achieve Goals: Good ADL Goals Pt Will Perform Lower Body Bathing: with modified independence;Unsupported;Sit to stand from chair;Sit to stand from bed;with adaptive equipment ADL Goal: Lower Body Bathing - Progress: Goal set today Pt Will Perform Lower Body Dressing: with modified independence;Unsupported;Sit to stand from chair;Sit to stand from bed;with adaptive equipment ADL Goal: Lower Body Dressing - Progress: Goal set today Pt Will Transfer to Toilet: with modified independence;Ambulation;with DME;3-in-1 ADL Goal: Toilet Transfer - Progress: Goal set today Pt Will Perform Toileting - Clothing Manipulation: Independently;Standing ADL Goal: Toileting - Clothing Manipulation - Progress: Goal set today Pt Will Perform Toileting - Hygiene: Independently;Sit to stand from 3-in-1/toilet ADL Goal: Toileting - Hygiene - Progress: Goal set today Pt Will Perform Tub/Shower Transfer: Shower transfer;with DME;Ambulation (3-n-1 in shower) ADL Goal: Tub/Shower Transfer - Progress: Goal set today  Visit Information  Last OT Received On: 11/01/11 Assistance Needed: +1    Subjective Data  Subjective: I won't fall in the floor until I am 105 in response to not getting up by herself.   (Upon entering room pt was getting herself out of the CPM and [place] getting up to the EOB) Patient Stated Goal: Go home tomorrow   Prior Functioning  Home Living Lives With: Spouse Available Help at Discharge:  (neighbor in and out (can A more so than husband per pt)) Type of Home: House Home Access: Level entry Home Layout: Two level;Able to live on main level with bedroom/bathroom Bathroom Shower/Tub: Walk-in shower;Door [employer] Toilet:  Standard Bathroom Accessibility: Yes How Accessible: Accessible via walker Home Adaptive Equipment: Grab bars in shower Prior Function Level of Independence: Independent Able to Take  Stairs?: Yes Driving: Yes Vocation: Retired Musician: No difficulties Dominant Hand: Right    Cognition  Overall Cognitive Status: Impaired Area of Impairment: Safety/judgement Arousal/Alertness: Awake/alert Orientation Level:  (Disoriented to where she lived initially) Behavior During Session: Other (comment) (impulsive) Memory Deficits: Initially said she goes to a church right around here, then corrected herself remembering that she is not at home. Could not remember what the name of the street she lived on intially (took about 1-2 minutes for her to come up with the number and name). SHe said that she has been forgetting stuff but that just comes with age. Safety/Judgement - Other Comments: Getting herself out of her CPM and up to EOB as I entered    Extremity/Trunk Assessment Right Upper Extremity Assessment RUE ROM/Strength/Tone: Within functional levels Left Upper Extremity Assessment LUE ROM/Strength/Tone: Within functional levels   Mobility Bed Mobility Bed Mobility: Supine to Sit;Sitting - Scoot to Edge of Bed Supine to Sit: 6: Modified independent (Device/Increase time);With rails;HOB elevated (2o degrees) Sitting - Scoot to Edge of Bed: 7: Independent Sit to Supine: HOB flat;3: Mod assist Details for Bed Mobility Assistance: Cues for technique.  (A) for LE management & to lower shoulders/trunk to supine.   Transfers Transfers: Sit to Stand;Stand to Sit Sit to Stand: 4: Min guard;With upper extremity assist;From bed Stand to Sit: 4: Min assist;Without upper extremity assist;With armrests Details for Transfer Assistance: Cues for hand placement & body positioning before sitting   Exercise    Balance    End of Session OT - End of Session Equipment Utilized During  Treatment: Gait belt Activity Tolerance: Patient tolerated treatment well Patient left: in chair;with call bell/phone within reach;with chair alarm set Nurse Communication: Other (comment) (Aware pt trying to get up by herself) CPM Left Knee CPM Left Knee: On   [name] [phone] 11/01/2011, 4:31 PM

## 2011-11-01 NOTE — Progress Notes (Signed)
CARE MANAGEMENT NOTE 11/01/2011  Patient:  Wanda Conner, Wanda Conner   Account Number:  [id number]  Date Initiated:  11/01/2011  Documentation initiated by:  Vance Peper  Subjective/Objective Assessment:   78 yr old female s/p left total knee arthroplasty     Action/Plan:   CM spoke with patient and contacted her husband to offer choice. Patient's rolling walker will be delivered to the room, 3in1 and CPM to the home.   Anticipated DC Date:  11/02/2011   Anticipated DC Plan:  HOME W HOME HEALTH SERVICES      DC Planning Services  CM consult      [facility] Choice  HOME HEALTH  DURABLE MEDICAL EQUIPMENT   Choice offered to / List presented to:  C-3 Spouse   DME arranged  3-N-1  Levan Hurst      DME agency  [facility]     HH arranged  HH-2 PT  HH-1 RN      [facility] agency  [facility]   Status of service:  Completed, signed off  Discharge Disposition:  HOME W HOME HEALTH SERVICES

## 2011-11-01 NOTE — Progress Notes (Signed)
ANTICOAGULATION CONSULT NOTE - Follow Up Consult  Pharmacy Consult for Warfarin Indication: VTE prophylaxis s/p L TKR on 6/10  Allergies  Allergen Reactions  . Omeprazole Other (See Comments)    "did me worse than it did good". Pt does not like this medication to treat her stomach symtoms    Patient Measurements: Height: 5\' 4"  (162.6 cm) Weight: 139 lb (63.05 kg) IBW/kg (Calculated) : 54.7   Vital Signs: Temp: 99.8 F (37.7 C) (06/11 0541) BP: 115/67 mmHg (06/11 0541) Pulse Rate: 88  (06/11 0541)  Labs:  Basename 11/01/11 0500  HGB 10.1*  HCT 30.6*  PLT 197  APTT --  LABPROT 14.9  INR 1.15  HEPARINUNFRC --  CREATININE 0.54  CKTOTAL --  CKMB --  TROPONINI --    Estimated Creatinine Clearance: 51.7 ml/min (by C-G formula based on Cr of 0.54).   Assessment: 78 y.o. F on warfarin for VTE px s/p L TKR on 6/10 with a SUBtherapeutic INR this a.m (INR 1.15, goal of 1.5-2). The patient is being bridged with Enox 30 mg q12h until INR therapeutic. Hgb/Hct/Plt drop with surgery however no active s/sx of bleeding noted at this time. The patient was educated on warfarin today -- she is familiar with the medication from being on it after her last orthopedic surgery in 2009.   Goal of Therapy:  INR 1.5-2 Monitor platelets by anticoagulation protocol: Yes   Plan:  1. Warfarin 5 mg x 1 dose at 1800 today 2. Will continue to monitor for any signs/symptoms of bleeding and will follow up with PT/INR in the a.m.   Georgina Pillion, PharmD, BCPS Clinical Pharmacist Pager: [phone] 11/01/2011 9:49 AM

## 2011-11-01 NOTE — Evaluation (Signed)
Physical Therapy Evaluation Patient Details Name: Wanda Conner MRN: [id number] DOB: [birth year] Today's Date: 11/01/2011 Time: 7829-5621 PT Time Calculation (min): 25 min  PT Assessment / Plan / Recommendation Clinical Impression  pt s/p L TKA.  Mobility limited by pain and decr knee stability. Pt is adamant about going home with HHPT, but may benefit from ST SNF    PT Assessment  Patient needs continued PT services    Follow Up Recommendations  Home health PT (vs ST SNF if slow to improve)    Barriers to Discharge        lEquipment Recommendations  Tub/shower seat;Rolling walker with 5" wheels    Recommendations for Other Services     Frequency 7X/week    Precautions / Restrictions Precautions Precautions: Knee   Pertinent Vitals/Pain Painful L knee--not rated       Mobility  Bed Mobility Bed Mobility: Not assessed Transfers Transfers: Sit to Stand;Stand to Sit Sit to Stand: 1: +2 Total assist Sit to Stand: Patient Percentage: 50% Stand to Sit: 3: Mod assist Details for Transfer Assistance: vc's for hand placement; lifting/stabilkty assist Ambulation/Gait Ambulation/Gait Assistance: 3: Mod assist Ambulation Distance (Feet): 7 Feet Assistive device: Rolling walker Ambulation/Gait Assistance Details: guarded, painful gait with difficulty advancing either LE; assist for stability  and w/shift Gait Pattern: Step-to pattern Stairs: No Wheelchair Mobility Wheelchair Mobility: No    Exercises Total Joint Exercises Ankle Circles/Pumps: AROM;10 reps Quad Sets: AAROM;Both;10 reps;Seated Heel Slides: AAROM;Left;10 reps   PT Diagnosis: Difficulty walking;Generalized weakness;Acute pain  PT Problem List: Decreased strength;Decreased range of motion;Decreased activity tolerance;Decreased balance;Decreased mobility;Decreased cognition;Decreased knowledge of use of DME;Pain PT Treatment Interventions: DME instruction;Gait training;Functional mobility training;Therapeutic  activities;Therapeutic exercise;Patient/family education   PT Goals Acute Rehab PT Goals PT Goal Formulation: With patient Time For Goal Achievement: 11/01/11 Potential to Achieve Goals: Good Pt will go Supine/Side to Sit: Independently;with HOB 0 degrees PT Goal: Supine/Side to Sit - Progress: Goal set today Pt will go Sit to Stand: with modified independence PT Goal: Sit to Stand - Progress: Goal set today Pt will Transfer Bed to Chair/Chair to Bed: with modified independence PT Transfer Goal: Bed to Chair/Chair to Bed - Progress: Goal set today Pt will Ambulate: 51 - 150 feet;with modified independence;with rolling walker PT Goal: Ambulate - Progress: Goal set today Pt will Perform Home Exercise Program: with supervision, verbal cues required/provided PT Goal: Perform Home Exercise Program - Progress: Goal set today  Visit Information  Last PT Received On: 11/01/11 Assistance Needed: +2    Subjective Data  Subjective: I want to go home tomorrow Patient Stated Goal: Home Independent   Prior Functioning  Home Living Lives With: Spouse Available Help at Discharge: Other (Comment) (neighbor for variable amounts of time) Type of Home: House Home Access: Level entry Home Layout: Two level;Able to live on main level with bedroom/bathroom Bathroom Shower/Tub: Health visitor: Standard Home Adaptive Equipment: Grab bars in shower Prior Function Level of Independence: Independent Able to Take Stairs?: Yes Communication Communication: No difficulties    Cognition  Overall Cognitive Status: Appears within functional limits for tasks assessed/performed Arousal/Alertness: Awake/alert Orientation Level: Appears intact for tasks assessed Behavior During Session: [facility] for tasks performed    Extremity/Trunk Assessment Right Lower Extremity Assessment RLE ROM/Strength/Tone: Within functional levels Left Lower Extremity Assessment LLE ROM/Strength/Tone: Deficits LLE  ROM/Strength/Tone Deficits: painful and stiff;  knee flexion AAROM ~60 degrees   Balance    End of Session PT - End of Session Activity Tolerance:  Patient limited by pain;Other (comment) (and mentation) Patient left: in chair;with call bell/phone within reach Nurse Communication: Mobility status CPM Left Knee CPM Left Knee: Off Left Knee Flexion (Degrees): 40    Tiwanna Tuch, Eliseo Gum 11/01/2011, 11:06 AM  11/01/2011  Tabernash Bing, PT [phone] (pager)

## 2011-11-02 LAB — PROTIME-INR: INR: 1.8 — ABNORMAL HIGH (ref 0.00–1.49)

## 2011-11-02 LAB — CBC
HCT: 25.3 % — ABNORMAL LOW (ref 36.0–46.0)
Hemoglobin: 8.5 g/dL — ABNORMAL LOW (ref 12.0–15.0)
MCV: 99.6 fL (ref 78.0–100.0)
RBC: 2.54 MIL/uL — ABNORMAL LOW (ref 3.87–5.11)
RDW: 13 % (ref 11.5–15.5)
WBC: 14.8 10*3/uL — ABNORMAL HIGH (ref 4.0–10.5)

## 2011-11-02 MED ORDER — HYDROCODONE-ACETAMINOPHEN 5-325 MG PO TABS: 1.0000 | ORAL_TABLET | ORAL | Status: DC | PRN

## 2011-11-02 MED ORDER — HYDROCODONE-ACETAMINOPHEN 5-325 MG PO TABS: 1.0000 | ORAL_TABLET | ORAL | Status: AC | PRN

## 2011-11-02 MED ORDER — WARFARIN SODIUM 2 MG PO TABS
2.0000 mg | ORAL_TABLET | Freq: Once | ORAL | Status: AC
  Administered 2011-11-02: 2 mg via ORAL
  Filled 2011-11-02: qty 1

## 2011-11-02 MED ORDER — METHOCARBAMOL 500 MG PO TABS: 500.0000 mg | ORAL_TABLET | Freq: Four times a day (QID) | ORAL | Status: AC | PRN

## 2011-11-02 MED ORDER — WARFARIN SODIUM 2 MG PO TABS: 2.0000 mg | ORAL_TABLET | Freq: Once | ORAL | Status: DC

## 2011-11-02 NOTE — Progress Notes (Signed)
Referral received for SNF. Chart reviewed and CSW has spoken with RNCM who indicates that patient is for DC to home with Home Health and DME.  CSW to sign off. Please re-consult if CSW needs arise.  Evella Kasal T. Alayna Mabe, LCSWA  [phone]  

## 2011-11-02 NOTE — Progress Notes (Signed)
At the beginning of the shift, patient was alert and oriented, her only concern was that she wanted to go home in the morning.  She stated that her husband is disabled and that she is concerned about him even though he has a caregiver at home. She stressed that I leave a note for the doctor.  As the evening progressed, patient's bed alarm kept going off, patient was confused and did not remember that she was in the hospital.  I reminded her of where she was and she was okay for about an hour.  Then around 4 am she took her IV out and took off her ace wrap to her left knee.  I assessed the IV site and placed the ace wrap on her left leg.

## 2011-11-02 NOTE — Progress Notes (Signed)
ANTICOAGULATION CONSULT NOTE - Follow Up Consult  Pharmacy Consult for Warfarin Indication: VTE prophylaxis s/p L TKR on 6/10  Allergies  Allergen Reactions  . Omeprazole Other (See Comments)    "did me worse than it did good". Pt does not like this medication to treat her stomach symtoms    Patient Measurements: Height: 5\' 4"  (162.6 cm) Weight: 139 lb (63.05 kg) IBW/kg (Calculated) : 54.7   Vital Signs: Temp: 99.9 F (37.7 C) (06/12 0600) Temp src: Oral (06/12 0600) BP: 127/85 mmHg (06/12 0600) Pulse Rate: 100  (06/12 0600)  Labs:  Basename 11/02/11 0545 11/01/11 0500  HGB 8.5* 10.1*  HCT 25.3* 30.6*  PLT 162 197  APTT -- --  LABPROT 21.2* 14.9  INR 1.80* 1.15  HEPARINUNFRC -- --  CREATININE -- 0.54  CKTOTAL -- --  CKMB -- --  TROPONINI -- --    Estimated Creatinine Clearance: 51.7 ml/min (by C-G formula based on Cr of 0.54).   Assessment: 78 y.o. F on warfarin for VTE px s/p L TKR on 6/10 with a therapeutic INR this a.m (INR 1.8 << 1.15, goal of 1.5-2). Will reduce dose today in response to large jump in INR.The patient was being bridged with Enox 30 mg q12h -- however this will stop today as INR is therapeutic. Hgb/Hct/Plt drop however no s/sx of bleeding noted -- dressing around the incision site is noted to be clean. The patient was educated on warfarin this admission -- she is familiar with the medication from being on it after her last orthopedic surgery in 2009.   Goal of Therapy:  INR 1.5-2 Monitor platelets by anticoagulation protocol: Yes   Plan:  1. Warfarin 2 mg x 1 dose at 1800 today 2. D/c Lovenox as INR therapeutic 3. Will continue to monitor for any signs/symptoms of bleeding and will follow up with PT/INR in the a.m.   Georgina Pillion, PharmD, BCPS Clinical Pharmacist Pager: [phone] 11/02/2011 8:59 AM

## 2011-11-02 NOTE — Progress Notes (Signed)
PT Progress Note:      11/02/11 0830  PT Visit Information  Last PT Received On 11/02/11  Assistance Needed +1  PT Time Calculation  PT Start Time 0745  PT Stop Time 0828  PT Time Calculation (min) 43 min  Precautions  Precautions Knee  Restrictions  LLE Weight Bearing WBAT  Cognition  Overall Cognitive Status Impaired  Area of Impairment Safety/judgement  Behavior During Session [facility] for tasks performed  Attention - Other Comments Cannot multi task, if she is trying to do something & starts talking she looses focus.  Requires frequent redirection cues to stay on task.    Memory Deficits Constantly repeats herself about her home situation (husband being disabled & having a lady that lives across the street who is going to provide care)  Bed Mobility  Bed Mobility Supine to Sit  Supine to Sit HOB flat;With rails;6: Modified independent (Device/Increase time)  Sitting - Scoot to Edge of Bed 6: Modified independent (Device/Increase time)  Details for Bed Mobility Assistance No physical (A) needed.  Pt used rail to mimic bedside table that she reports to using at home to get in/out of bed.  Cues to stay focused on task due to pt easily distracted & looses focus when she begins to talk.    Transfers  Transfers Sit to Stand;Stand to Sit  Sit to Stand 3: Mod assist;With upper extremity assist;From bed  Stand to Sit 4: Min assist;With upper extremity assist;With armrests;To chair/3-in-1  Details for Transfer Assistance Max cues for hand placement, body positioning before sitting, & LLE positioning to decrease stress placed on knee.  Pt slow to achieve full standing position  Ambulation/Gait  Ambulation/Gait Assistance 4: Min guard  Ambulation Distance (Feet) 100 Feet  Assistive device Rolling walker  Ambulation/Gait Assistance Details Cues for sequencing, safe RW management/advancement, tall posture, increased step length.  Pt looses focus when she begins to talk & gets out of sequence.     Gait Pattern Step-to pattern;Decreased step length - right;Decreased step length - left;Decreased stance time - left;Decreased weight shift to left  Stairs No  Wheelchair Mobility  Wheelchair Mobility No  Balance  Balance Assessed No  Exercises  Exercises Total Joint  Total Joint Exercises  Ankle Circles/Pumps AROM;10 reps  Quad Sets AROM;Both;10 reps  Heel Slides AAROM;Left;10 reps  Hip ABduction/ADduction AAROM;Left;10 reps  Straight Leg Raises AAROM;Left;10 reps  Long [address] [place];Left;10 reps  PT - End of Session  Equipment Utilized During Treatment Gait belt  Activity Tolerance Patient tolerated treatment well  Patient left in chair;with call bell/phone within reach  PT - Assessment/Plan  Comments on Treatment Session Pt answers orientation questions appropriately but cognition appears to be slightly altered.  Pt perseverating on home situation, d/cing home, & available assistance at home.  Pt looses focus easily when she begins to talk & requires frequent redirectional cues throughout session.    PT Plan Discharge plan remains appropriate  PT Frequency 7X/week  Follow Up Recommendations Home health PT;Supervision/Assistance - 24 hour;Other (comment)  Equipment Recommended 3 in 1 bedside comode;Rolling walker with 5" wheels;Tub/shower seat  Acute Rehab PT Goals  Time For Goal Achievement 11/01/11  Potential to Achieve Goals Good  PT Goal: Supine/Side to Sit - Progress Progressing toward goal  PT Goal: Sit to Stand - Progress Not met  PT Goal: Ambulate - Progress Not met  PT Goal: Perform Home Exercise Program - Progress Not met     Verdell Face, [place] [phone] 11/02/2011

## 2011-11-02 NOTE — Progress Notes (Signed)
Occupational Therapy Treatment Patient Details Name: Wanda Conner MRN: [id number] DOB: 21-Aug-1933 Today's Date: 11/02/2011 Time: 7829-5621 OT Time Calculation (min): 45 min  OT Assessment / Plan / Recommendation Comments on Treatment Session Pt still having cognitive issues that are not normal in my opinion, per husband who I spoke with over phone a caregiver will be with them 24/7 for as long as it is needed.    Follow Up Recommendations  Home health OT    Barriers to Discharge       Equipment Recommendations  3 in 1 bedside comode    Recommendations for Other Services    Frequency Min 2X/week   Plan Discharge plan needs to be updated    Precautions / Restrictions Precautions Precautions: Knee Restrictions Weight Bearing Restrictions: Yes LLE Weight Bearing: Weight bearing as tolerated   Pertinent Vitals/Pain 8/10 LLE    ADL  Grooming: Performed;Wash/dry hands;Wash/dry face;Teeth care;Brushing hair;Min guard Where Assessed - Grooming: Unsupported standing Toilet Transfer: Performed;Minimal assistance Toilet Transfer Method: Sit to stand Toilet Transfer Equipment: Raised toilet seat with arms (or 3-in-1 over toilet) Toileting - Clothing Manipulation and Hygiene: Minimal assistance Where Assessed - Glass blower/designer Manipulation and Hygiene: Sit to stand from 3-in-1 or toilet Tub/Shower Transfer: Performed;Minimal assistance Tub/Shower Transfer Method: Science writer:  (3-n-1 facing out) Equipment Used: Gait belt;Rolling walker Transfers/Ambulation Related to ADLs: Mod A sit to stand from recliner with max cues to scoot out to edge of chair first then sit to stand pushing up from chair. min guard A with ambulation    OT Diagnosis:    OT Problem List:   OT Treatment Interventions:     OT Goals ADL Goals ADL Goal: Toilet Transfer - Progress: Not progressing (due to cognition) ADL Goal: Toileting - Clothing Manipulation - Progress: Not  progressing (due to cognition)  Visit Information  Last OT Received On: 11/02/11 Assistance Needed: +1    Subjective Data      Prior Functioning       Cognition  Overall Cognitive Status: Impaired Area of Impairment: Attention;Problem solving Attention - Other Comments: Cannot multi task, if she is trying to do something and starts talking she looses focus Problem Solving: Could not figure out how to get up to stand from chair without Max verbal cues    Mobility Transfers Transfers: Sit to Stand;Stand to Sit Sit to Stand: 3: Mod assist;With upper extremity assist;With armrests;From chair/3-in-1 Stand to Sit: 4: Min guard;With upper extremity assist;With armrests;To chair/3-in-1   Exercises    Balance    End of Session OT - End of Session Equipment Utilized During Treatment: Gait belt Activity Tolerance: Patient tolerated treatment well Patient left: in chair;with call bell/phone within reach;with chair alarm set CPM Left Knee CPM Left Knee: [address]   [name] [phone]   11/02/2011, 11:54 AM

## 2011-11-02 NOTE — Progress Notes (Signed)
PATIENT ID: Wanda Conner  MRN: [id number]  DOB/AGE:  76-Apr-1935 / 76 y.o.  2 Days Post-Op Procedure(s) (LRB): TOTAL KNEE ARTHROPLASTY (Left)    PROGRESS NOTE Subjective: Patient is alert, oriented, no Nausea, no Vomiting, yes passing gas, no Bowel Movement. Taking PO well. Denies SOB, Chest or Calf Pain. Using Incentive Spirometer, PAS in place. Ambulating slowly with PT.  Patient was confused last night, but seems better this morning.  Alert and oriented. Patient reports pain as moderate  .    Objective: Vital signs in last 24 hours: Filed Vitals:   11/01/11 1328 11/01/11 1600 11/01/11 2108 11/02/11 0600  BP: 122/65  147/65 127/85  Pulse: 87  95 100  Temp: 98.9 F (37.2 C)  100.1 F (37.8 C) 99.9 F (37.7 C)  TempSrc:    Oral  Resp: 20 18 20 16   Height:      Weight:      SpO2: 96% 97% 94% 100%      Intake/Output from previous day: I/O last 3 completed shifts: In: 480 [P.O.:480] Out: 90 [Drains:90]   Intake/Output this shift:     LABORATORY DATA:  Basename 11/02/11 0545 11/01/11 0500  WBC 14.8* 12.8*  HGB 8.5* 10.1*  HCT 25.3* 30.6*  PLT 162 197  NA -- 138  K -- 3.6  CL -- 101  CO2 -- 26  BUN -- 11  CREATININE -- 0.54  GLUCOSE -- 140*  GLUCAP -- --  INR 1.80* 1.15  CALCIUM -- 8.5    Examination: Neurologically intact ABD soft Neurovascular intact Sensation intact distally Intact pulses distally Dorsiflexion/Plantar flexion intact Incision: scant drainage}  Assessment:   2 Days Post-Op Procedure(s) (LRB): TOTAL KNEE ARTHROPLASTY (Left) ADDITIONAL DIAGNOSIS:  Acute Blood Loss Anemia - asymptomatic Plan: PT/OT WBAT, CPM 5/hrs day until ROM 0-90 degrees, then D/C CPM DVT Prophylaxis:  SCDx72hrs, ASA 325 mg BID x 2 weeks DISCHARGE PLAN: Home DISCHARGE NEEDS: HHPT, HHRN, CPM, Walker and 3-in-1 comode seat     Revanth Neidig M. 11/02/2011, 8:13 AM

## 2011-11-02 NOTE — Progress Notes (Signed)
PT Progress Note:     11/02/11 1500  PT Visit Information  Last PT Received On 11/02/11  Assistance Needed +1  PT Time Calculation  PT Start Time 1341  PT Stop Time 1403  PT Time Calculation (min) 22 min  Subjective Data  Subjective "ive been waiting for you to come back all day"  Precautions  Precautions Knee  Restrictions  LLE Weight Bearing WBAT  Cognition  Overall Cognitive Status Impaired  Area of Impairment Attention;Problem solving  Orientation Level Oriented X4 / Intact  Behavior During Session [facility] for tasks performed  Current Attention Level Other (comment) (easily looses focus on task when she begins to talk)  Attention - Other Comments Cannot multi task, if she is trying to do something and starts talking she looses focus  Safety/Judgement Decreased awareness of safety precautions;Decreased awareness of need for assistance  Safety/Judgement - Other Comments Pt was observed attempting to get herself up without (A) between AM/PM session.  Pt reports "Im trying to get to my stove.  I cook on sundays"  Bed Mobility  Bed Mobility Sit to Supine  Sit to Supine HOB flat;4: Min assist  Details for Bed Mobility Assistance (A) for LE's.  Cues for technique.    Transfers  Transfers Sit to Stand;Stand to Sit  Sit to Stand 4: Min assist;With upper extremity assist;With armrests;From chair/3-in-1  Stand to Sit 4: Min guard;With upper extremity assist;To bed  Details for Transfer Assistance Cont's to require max cues for safe technique.  Pt with difficulty with carryover from previous PT sessions.    Ambulation/Gait  Ambulation/Gait Assistance 4: Min guard  Ambulation Distance (Feet) 90 Feet  Assistive device Rolling walker  Ambulation/Gait Assistance Details Cont's to require mod cueing for proper/safe technique.    Gait Pattern Decreased step length - right;Decreased step length - left;Decreased weight shift to left  Stairs No  Wheelchair Mobility  Wheelchair Mobility No    Balance  Balance Assessed No  PT - End of Session  Equipment Utilized During Treatment Gait belt  Activity Tolerance Patient tolerated treatment well  Patient left in bed;in CPM;with call bell/phone within reach  PT - Assessment/Plan  Comments on Treatment Session Pt seems to have difficulty with carryout of safe technique with mobiliity from previous PT sessions.  As of today, pt will definately need 24 hr assist at home.    PT Plan Discharge plan remains appropriate  PT Frequency 7X/week  Follow Up Recommendations Home health PT;Supervision/Assistance - 24 hour;Other (comment)  Equipment Recommended 3 in 1 bedside comode;Rolling walker with 5" wheels;Tub/shower seat  Acute Rehab PT Goals  Time For Goal Achievement 11/01/11  Potential to Achieve Goals Good  PT Goal: Sit to Stand - Progress Progressing toward goal  PT Goal: Ambulate - Progress Not met  PT General Charges  $$ ACUTE PT VISIT 1 Procedure  PT Treatments  $Gait Training 8-22 mins    Pain:  8/10 knee.  RN made aware & administered pain meds at end of PT session.     Verdell Face, [place] [phone] 11/02/2011

## 2011-11-03 LAB — PROTIME-INR
INR: 1.79 — ABNORMAL HIGH (ref 0.00–1.49)
Prothrombin Time: 21.1 seconds — ABNORMAL HIGH (ref 11.6–15.2)

## 2011-11-03 LAB — CBC
Hemoglobin: 7.6 g/dL — ABNORMAL LOW (ref 12.0–15.0)
MCHC: 34.4 g/dL (ref 30.0–36.0)
RDW: 13.3 % (ref 11.5–15.5)

## 2011-11-03 NOTE — Progress Notes (Signed)
PT Progress Note:     11/03/11 1000  PT Visit Information  Last PT Received On 11/03/11  Assistance Needed +1  PT Time Calculation  PT Start Time 0903  PT Stop Time 0929  PT Time Calculation (min) 26 min  Subjective Data  Subjective "I just need my vitamins"  Precautions  Precautions Knee  Restrictions  LLE Weight Bearing WBAT  Cognition  Overall Cognitive Status Impaired  Area of Impairment Attention;Safety/judgement  Arousal/Alertness Awake/alert  Orientation Level Oriented X4 / Intact  Behavior During Session [facility] for tasks performed  Current Attention Level Other (comment) (needs redirectional cues to stay on task when she talks.  )  Attention - Other Comments Cont's to have difficulty multi-tasking but somewhat improved from previous PT session.  Still needs redirectional cues to stay on task when she begins to talk  Safety/Judgement Decreased awareness of need for assistance;Decreased awareness of safety precautions  Safety/Judgement - Other Comments Pt reports she has been to/from bathroom by herself without anybody in the room to assist her.    Bed Mobility  Bed Mobility Supine to Sit;Sitting - Scoot to Edge of Bed  Supine to Sit HOB flat;6: Modified independent (Device/Increase time)  Sitting - Scoot to Edge of Bed 6: Modified independent (Device/Increase time)  Transfers  Transfers Sit to Stand;Stand to Sit  Sit to Stand 4: Min guard;With upper extremity assist;From bed;From chair/3-in-1  Stand to Sit 5: Supervision;With upper extremity assist;With armrests;To chair/3-in-1  Details for Transfer Assistance Cues for safe technique & body positioning.    Ambulation/Gait  Ambulation/Gait Assistance 4: Min guard  Ambulation Distance (Feet) 120 Feet  Assistive device Rolling walker  Ambulation/Gait Assistance Details No physical (A) needed.  Cues for sequencing, increased step length.    Gait Pattern Decreased step length - right;Decreased step length - left  Stairs No    Wheelchair Mobility  Wheelchair Mobility No  Balance  Balance Assessed No  Exercises  Exercises Total Joint  Total Joint Exercises  Ankle Circles/Pumps AROM;Both;15 reps  Quad Sets AROM;Both;15 reps  Straight Leg Raises AAROM;Left;15 reps  Long [address] [place];Left;15 reps  Knee Flexion AAROM;Left;Other reps (comment);Seated (12 reps; seated EOB)  PT - End of Session  Equipment Utilized During Treatment Gait belt  Activity Tolerance Patient tolerated treatment well  Patient left in chair;with call bell/phone within reach  PT - Assessment/Plan  Comments on Treatment Session Pt with improved mobility.  Unsure if pt's current cognitive level is baseline or not but it is somewhat improved from previous PT session.  Pt very eager to d/c home.    PT Plan Discharge plan remains appropriate  PT Frequency 7X/week  Follow Up Recommendations Home health PT;Supervision/Assistance - 24 hour;Other (comment)  Equipment Recommended Rolling walker with 5" wheels;3 in 1 bedside comode;Tub/shower seat  Acute Rehab PT Goals  Time For Goal Achievement 11/01/11  Potential to Achieve Goals Good  PT Goal: Supine/Side to Sit - Progress Progressing toward goal  PT Goal: Sit to Stand - Progress Progressing toward goal  PT Goal: Ambulate - Progress Progressing toward goal  PT Goal: Perform Home Exercise Program - Progress Progressing toward goal  PT General Charges  $$ ACUTE PT VISIT 1 Procedure  PT Treatments  $Gait Training 8-22 mins  $Therapeutic Exercise 8-22 mins     Verdell Face, [place] [phone] 11/03/2011

## 2011-11-03 NOTE — Progress Notes (Signed)
Occupational Therapy Treatment Patient Details Name: Wanda Conner MRN: [id number] DOB: May 06, 1934 Today's Date: 11/03/2011 Time: 7829-5621 OT Time Calculation (min): 23 min  OT Assessment / Plan / Recommendation Comments on Treatment Session The pt is more cognitively aware today than on previous days, making progress towards goals.    Follow Up Recommendations  Home health OT    Barriers to Discharge       Equipment Recommendations  Rolling walker with 5" wheels;3 in 1 bedside comode    Recommendations for Other Services    Frequency Min 2X/week   Plan Discharge plan remains appropriate    Precautions / Restrictions Precautions Precautions: Knee Restrictions Weight Bearing Restrictions: Yes LLE Weight Bearing: Weight bearing as tolerated   Pertinent Vitals/Pain     ADL  Lower Body Dressing: Set up;Supervision/safety;Performed Where Assessed - Lower Body Dressing: Unsupported sit to stand Tub/Shower Transfer: Performed;Supervision/safety Tub/Shower Transfer Method: Science writer:  (3-n-1 in shower) Equipment Used: Gait belt;Rolling walker Transfers/Ambulation Related to ADLs: S to sit to stand from recliner    OT Diagnosis:    OT Problem List:   OT Treatment Interventions:     OT Goals ADL Goals ADL Goal: Lower Body Dressing - Progress: Progressing toward goals ADL Goal: Toilet Transfer - Progress: Progressing toward goals ADL Goal: Tub/Shower Transfer - Progress: Progressing toward goals  Visit Information  Last OT Received On: 11/03/11 Assistance Needed: +1    Subjective Data      Prior Functioning       Cognition  Overall Cognitive Status: Impaired (but better today than previous days) Area of Impairment: Memory Arousal/Alertness: Awake/alert Orientation Level: Oriented X4 / Intact Behavior During Session: [facility] for tasks performed Current Attention Level: Sustained Safety/Judgement - Other Comments: Was safe with sit to  stands and stand to sits today reaching back to and pushing up from arm rests.    Mobility Transfers Transfers: Sit to Stand;Stand to Sit Sit to Stand: 5: Supervision;With upper extremity assist;With armrests;From chair/3-in-1 Stand to Sit: 5: Supervision;With upper extremity assist;With armrests;To chair/3-in-1   Exercises    Balance    End of Session OT - End of Session Equipment Utilized During Treatment: Gait belt Activity Tolerance: Patient tolerated treatment well Patient left: in chair;with call bell/phone within reach;with chair alarm set   [name] [phone] 11/03/2011, 4:35 PM

## 2011-11-03 NOTE — Progress Notes (Signed)
PATIENT ID: Wanda Conner  MRN: [id number]  DOB/AGE:  Jun 24, 1933 / 78 y.o.  3 Days Post-Op Procedure(s) (LRB): TOTAL KNEE ARTHROPLASTY (Left)    PROGRESS NOTE Subjective: Patient is alert, oriented, no Nausea, no Vomiting, yes passing gas, no Bowel Movement. Taking PO well. Denies SOB, Chest or Calf Pain. Using Incentive Spirometer, PAS in place.  Denies fatigue.  Confusion seems to have resolved.   Ambulating well with PT. Patient reports pain as moderate  .    Objective: Vital signs in last 24 hours: Filed Vitals:   11/02/11 1200 11/02/11 1411 11/02/11 2200 11/03/11 0600  BP:  143/76 124/76 145/67  Pulse:  90 97 96  Temp:  97.8 F (36.6 C) 99.2 F (37.3 C) 99 F (37.2 C)  TempSrc:   Oral Oral  Resp: 16 20 16 14   Height:      Weight:      SpO2: 96% 93% 98% 96%      Intake/Output from previous day: I/O last 3 completed shifts: In: 840 [P.O.:840] Out: -    Intake/Output this shift:     LABORATORY DATA:  Basename 11/03/11 0500 11/02/11 0545 11/01/11 0500  WBC 14.1* 14.8* --  HGB 7.6* 8.5* --  HCT 22.1* 25.3* --  PLT 166 162 --  NA -- -- 138  K -- -- 3.6  CL -- -- 101  CO2 -- -- 26  BUN -- -- 11  CREATININE -- -- 0.54  GLUCOSE -- -- 140*  GLUCAP -- -- --  INR 1.79* 1.80* --  CALCIUM -- -- 8.5    Examination: Neurologically intact ABD soft Neurovascular intact Sensation intact distally Intact pulses distally Dorsiflexion/Plantar flexion intact Incision: scant drainage}  Assessment:   3 Days Post-Op Procedure(s) (LRB): TOTAL KNEE ARTHROPLASTY (Left) ADDITIONAL DIAGNOSIS:  Acute Blood Loss Anemia - asymptomatic  Plan: PT/OT WBAT, CPM 5/hrs day until ROM 0-90 degrees, then D/C CPM DVT Prophylaxis:  SCDx72hrs, Coumadin x 2 weeks. DISCHARGE PLAN: Home today  DISCHARGE NEEDS: HHPT, HHRN, CPM, Walker and 3-in-1 comode seat\     [name] M. 11/03/2011, 9:28 AM

## 2011-11-03 NOTE — Discharge Summary (Signed)
Patient ID: Wanda Conner MRN: [id number] DOB/AGE: [birth year] 78 y.o.  Admit date: 10/31/2011 Discharge date: 11/03/2011  Admission Diagnoses:  Active Problems:  Osteoarthritis of left knee   Discharge Diagnoses:  Same  Past Medical History  Diagnosis Date  . Depression   . GERD (gastroesophageal reflux disease)   . Anemia     History of this  . Seasonal allergies   . Arthritis     Surgeries: Procedure(s): TOTAL KNEE ARTHROPLASTY on 10/31/2011   Consultants:    Discharged Condition: Improved  Hospital Course: Wanda Conner is an 78 y.o. female who was admitted 10/31/2011 for operative treatment of<principal problem not specified>. Patient has severe unremitting pain that affects sleep, daily activities, and work/hobbies. After pre-op clearance the patient was taken to the operating room on 10/31/2011 and underwent  Procedure(s): TOTAL KNEE ARTHROPLASTY.    Patient was given perioperative antibiotics: Anti-infectives     Start     Dose/Rate Route Frequency Ordered Stop   10/31/11 1107   cefUROXime (ZINACEF) injection  Status:  Discontinued          As needed 10/31/11 1108 10/31/11 1204   10/30/11 1041   ceFAZolin (ANCEF) IVPB 2 g/50 mL premix  Status:  Discontinued        2 g 100 mL/hr over 30 Minutes Intravenous 60 min pre-op 10/30/11 1041 10/31/11 1336           Patient was given sequential compression devices, early ambulation, and chemoprophylaxis to prevent DVT.  Patient benefited maximally from hospital stay and there were no complications.    Recent vital signs: Patient Vitals for the past 24 hrs:  BP Temp Temp src Pulse Resp SpO2  11/03/11 0600 145/67 mmHg 99 F (37.2 C) Oral 96  14  96 %  2011/11/09 2200 124/76 mmHg 99.2 F (37.3 C) Oral 97  16  98 %  11/09/2011 1411 143/76 mmHg 97.8 F (36.6 C) - 90  20  93 %  11-09-2011 1200 - - - - 16  96 %     Recent laboratory studies:  Basename 11/03/11 0500 11/09/2011 0545 11/01/11 0500  WBC 14.1* 14.8* --    HGB 7.6* 8.5* --  HCT 22.1* 25.3* --  PLT 166 162 --  NA -- -- 138  K -- -- 3.6  CL -- -- 101  CO2 -- -- 26  BUN -- -- 11  CREATININE -- -- 0.54  GLUCOSE -- -- 140*  INR 1.79* 1.80* --  CALCIUM -- -- 8.5     Discharge Medications:   Medication List  As of 11/03/2011  9:31 AM   TAKE these medications         CALTRATE 600+D 600-400 MG-UNIT per tablet   Generic drug: Calcium Carbonate-Vitamin D   Take 1 tablet by mouth 2 (two) times daily.      CENTRUM SILVER ULTRA WOMENS Tabs   Take 1 tablet by mouth daily.      FISH OIL PO   Take 1 capsule by mouth every morning.      HYDROcodone-acetaminophen 5-325 MG per tablet   Commonly known as: NORCO   Take 1-2 tablets by mouth every 4 (four) hours as needed.      ibuprofen 600 MG tablet   Commonly known as: ADVIL,MOTRIN   Take 600 mg by mouth 3 (three) times daily as needed.      Iron 240 (27 FE) MG Tabs   Take 0.5 tablets by mouth daily.  methocarbamol 500 MG tablet   Commonly known as: ROBAXIN   Take 1 tablet (500 mg total) by mouth every 6 (six) hours as needed.      potassium gluconate 595 MG Tabs   Take 595 mg by mouth daily.      warfarin 2 MG tablet   Commonly known as: COUMADIN   Take 1 tablet (2 mg total) by mouth one time only at 6 PM. Take as directed by home health.  Target INR 1.5-2.0            Diagnostic Studies: Dg Chest 2 View  10/25/2011  *RADIOLOGY REPORT*  Clinical Data: Preop for left knee replacement  CHEST - 2 VIEW  Comparison: 10/23/2007  Findings: The heart size is upper normal and stable.  The thoracic aorta contour is tortuous and stable.  The lungs are clear. Negative for pleural effusion or pneumothorax. Degenerative changes of the thoracic spine.  IMPRESSION: No acute cardiopulmonary disease.  Original Report Authenticated By: Britta Mccreedy, M.D.    Disposition:   Discharge Orders    Future Orders Please Complete By Expires   Increase activity slowly      Walker       May shower  / Bathe      Driving Restrictions      Comments:   No driving for 2 weeks.   Change dressing (specify)      Comments:   Dressing change as needed.   Call MD for:  temperature >100.4      Call MD for:  severe uncontrolled pain      Call MD for:  redness, tenderness, or signs of infection (pain, swelling, redness, odor or green/yellow discharge around incision site)      Discharge instructions      Comments:   F/U with Dr. Turner Daniels as scheduled.   Increase activity slowly      Walker       May shower / Bathe      Driving Restrictions      Comments:   No driving for 2 weeks.   Change dressing (specify)      Comments:   Dressing change as needed.   Call MD for:  temperature >100.4      Call MD for:  severe uncontrolled pain      Call MD for:  redness, tenderness, or signs of infection (pain, swelling, redness, odor or green/yellow discharge around incision site)            SignedShirl Harris M. 11/03/2011, 9:31 AM

## 2011-11-21 ENCOUNTER — Encounter: Payer: Self-pay | Admitting: Family Medicine

## 2011-11-21 ENCOUNTER — Ambulatory Visit (INDEPENDENT_AMBULATORY_CARE_PROVIDER_SITE_OTHER): Payer: Medicare Other | Admitting: Family Medicine

## 2011-11-21 VITALS — BP 131/67 | HR 77 | Temp 96.7°F | Ht 64.0 in | Wt 134.1 lb

## 2011-11-21 DIAGNOSIS — M1710 Unilateral primary osteoarthritis, unspecified knee: Secondary | ICD-10-CM

## 2011-11-21 DIAGNOSIS — I10 Essential (primary) hypertension: Secondary | ICD-10-CM | POA: Insufficient documentation

## 2011-11-21 DIAGNOSIS — M1712 Unilateral primary osteoarthritis, left knee: Secondary | ICD-10-CM

## 2011-11-21 HISTORY — DX: Essential (primary) hypertension: I10

## 2011-11-21 MED ORDER — HYDROCHLOROTHIAZIDE 12.5 MG PO TABS: 12.5000 mg | ORAL_TABLET | Freq: Every day | ORAL | Status: DC

## 2011-11-21 NOTE — Patient Instructions (Addendum)
It is important that you elevate your legs OVER your heart while resting. Start taking HCTZ 12.5 mg one tablet per day. If knee swelling or pain worsens, please call your orthopedic surgeon. Schedule follow up appointment with me in 4 weeks.

## 2011-11-21 NOTE — Assessment & Plan Note (Signed)
Moderate knee swelling status post surgery.  No evidence of infection/erythema around wound site.  Incision healing well. Patient to follow up with PT at [facility] tomorrow and will schedule appointment with surgeon to evaluate for seroma/hematoma but this is unlikely.  Encouraged patient to elevate legs over her heart while resting.

## 2011-11-21 NOTE — Progress Notes (Signed)
  Subjective:    Patient ID: Wanda Conner, female    DOB: 06/19/1933, 78 y.o.   MRN: [id number]  HPI  Patient presents to clinic to meet new doctor after recent knee surgery on 6/10.  She is doing well, but complains of her knee swelling.  She has been working with PT and goes back tomorrow.  She denies any knee pain with swelling, but knee does become painful at bedtime.  She says she has been elevating left leg at bedtime, however she was letting her leg dangle off the pillow.  Patient continues to take Tylenol arthritis at bedtime for pain which is helpful.  She denies any SOB, dyspnea, or numbness/swelling of extremities.  She will go to PT tomorrow and try to schedule an appointment with Ortho at that time.  Elevated BP today.  Denies any CP, fatigue, HA, blurry vision, numbness, nausea or vomiting.  Normal voids.  Has some ankle edema on LT that could be related to operation.  Review of Systems  Per HPI    Objective:   Physical Exam  Constitutional: No distress.  Cardiovascular: Normal rate and regular rhythm.   Murmur heard. Pulmonary/Chest: Effort normal and breath sounds normal. No respiratory distress. She has no wheezes. She has no rales.  Musculoskeletal:       LT knee: incision dry and healing well, bandage over last incision, but no active bleeding; knee is swollen but non-tender to touch; no erythema or induration Gait: stable with walker          Assessment & Plan:

## 2011-11-21 NOTE — Assessment & Plan Note (Signed)
Has had multiple visits with elevated BP but patient unwilling to start treatment before; now she is willing to start Hydrochlorothiazide 12.5 mg daily since it may help with pedal edema.  Follow up in 4 weeks.

## 2011-12-19 ENCOUNTER — Ambulatory Visit: Payer: Medicare Other | Admitting: Family Medicine

## 2012-02-08 ENCOUNTER — Encounter: Payer: Self-pay | Admitting: Family Medicine

## 2012-02-08 ENCOUNTER — Ambulatory Visit (INDEPENDENT_AMBULATORY_CARE_PROVIDER_SITE_OTHER): Payer: Medicare Other | Admitting: Family Medicine

## 2012-02-08 VITALS — BP 129/97 | HR 71 | Temp 97.9°F | Ht 64.0 in | Wt 131.0 lb

## 2012-02-08 DIAGNOSIS — Z0000 Encounter for general adult medical examination without abnormal findings: Secondary | ICD-10-CM

## 2012-02-08 DIAGNOSIS — I10 Essential (primary) hypertension: Secondary | ICD-10-CM

## 2012-02-08 MED ORDER — HYDROCHLOROTHIAZIDE 12.5 MG PO TABS: 12.5000 mg | ORAL_TABLET | Freq: Every day | ORAL | Status: DC

## 2012-02-08 NOTE — Assessment & Plan Note (Addendum)
Will restart HCTZ 12.5 mg daily.  Follow up in 6 months or sooner as needed.

## 2012-02-08 NOTE — Assessment & Plan Note (Signed)
Patient declines colonoscopy at this time due to medical bills from knee surgery.  Afraid she cannot afford it right now. No specific guidelines regarding screening mammograms at age 77.  Normal mammogram in 2008.

## 2012-02-08 NOTE — Progress Notes (Signed)
  Subjective:     Wanda Conner is a 78 y.o. female and is here for a comprehensive physical exam. The patient reports no problems after knee surgery in June.  She has completed PT and is now exercising on a stationary bike.  She is eating a healthy, balanced diet.  Patient was started on HCTZ at her last visit, but she only took this for one month.  She was urinating too frequently, so she did not request refills.    Patient denies any CP, SOB, fatigue, nausea/vomiting, blurry vision, or numbness/tingling of extremities.  She has been stressed lately, taking care of her husband who seems to be quite ill.  Patient had a normal mammogram in 2008.  She has not had a colonoscopy yet, and wants to wait until she is done paying medical bills from her knee surgery.  Denies any unintentional weight loss or bloody stools.  History   Social History  . Marital Status: Married    Spouse Name: N/A    Number of Children: N/A  . Years of Education: N/A   Occupational History  . Not on file.   Social History Main Topics  . Smoking status: Never Smoker   . Smokeless tobacco: Not on file  . Alcohol Use: No  . Drug Use: No  . Sexually Active: No   Other Topics Concern  . Not on file   Social History Narrative   Patient very religious, this informs much of her decision making.  Also very leery of Westernized medicine, refuses all but a few medications.  Declines all screening and preventative health at all.     Health Maintenance  Topic Date Due  . Tetanus/tdap  08/22/2011  . Influenza Vaccine  01/22/2012  . Colonoscopy  12/28/2020  . Pneumococcal Polysaccharide Vaccine Age 16 And Over  Addressed  . Zostavax  Addressed    The following portions of the patient's history were reviewed and updated as appropriate: allergies, current medications and problem list.  Review of Systems Pertinent items are noted in HPI.   Objective:    General appearance: alert, cooperative and no distress Eyes:  conjunctivae/corneas clear. PERRL, EOM's intact. Fundi benign. Throat: lips, mucosa, and tongue normal; teeth and gums normal Lungs: clear to auscultation bilaterally Heart: regular rate and rhythm, S1, S2 normal, no murmur, click, rub or gallop Abdomen: soft, non-tender; bowel sounds normal; no masses,  no organomegaly Extremities: extremities normal, atraumatic, no cyanosis or edema Skin: Skin color, texture, turgor normal. No rashes or lesions    Assessment:    Healthy female exam.        Plan:    See Problem List.

## 2012-02-08 NOTE — Patient Instructions (Addendum)
It was great to see you today, Wanda Conner. Your blood pressure is 129/97.  Continue to exercise and follow a low sodium diet. If you check your BP at home and it is over 140/90, please restart your HCTZ tablet once a day. If you are interested scheduling a colonoscopy, please call us. Your last mammogram 5 years ago was normal. Schedule your next appointment with me in 6 months or sooner as needed.

## 2012-12-12 ENCOUNTER — Encounter: Payer: Self-pay | Admitting: Family Medicine

## 2012-12-12 ENCOUNTER — Ambulatory Visit (HOSPITAL_COMMUNITY)
Admission: RE | Admit: 2012-12-12 | Discharge: 2012-12-12 | Disposition: A | Discharge status: Home / Self Care | Payer: BC Managed Care – PPO | Source: Ambulatory Visit | Attending: Family Medicine | Admitting: Family Medicine

## 2012-12-12 ENCOUNTER — Ambulatory Visit (INDEPENDENT_AMBULATORY_CARE_PROVIDER_SITE_OTHER): Payer: Self-pay | Admitting: Family Medicine

## 2012-12-12 VITALS — BP 154/85 | HR 73 | Temp 98.3°F | Ht 64.0 in | Wt 130.0 lb

## 2012-12-12 DIAGNOSIS — M898X9 Other specified disorders of bone, unspecified site: Secondary | ICD-10-CM | POA: Insufficient documentation

## 2012-12-12 DIAGNOSIS — I10 Essential (primary) hypertension: Secondary | ICD-10-CM

## 2012-12-12 DIAGNOSIS — N3941 Urge incontinence: Secondary | ICD-10-CM

## 2012-12-12 DIAGNOSIS — R002 Palpitations: Secondary | ICD-10-CM

## 2012-12-12 DIAGNOSIS — D62 Acute posthemorrhagic anemia: Secondary | ICD-10-CM

## 2012-12-12 DIAGNOSIS — I517 Cardiomegaly: Secondary | ICD-10-CM | POA: Insufficient documentation

## 2012-12-12 DIAGNOSIS — I452 Bifascicular block: Secondary | ICD-10-CM | POA: Insufficient documentation

## 2012-12-12 DIAGNOSIS — Q799 Congenital malformation of musculoskeletal system, unspecified: Secondary | ICD-10-CM

## 2012-12-12 DIAGNOSIS — Z23 Encounter for immunization: Secondary | ICD-10-CM

## 2012-12-12 HISTORY — DX: Palpitations: R00.2

## 2012-12-12 MED ORDER — HYDROCHLOROTHIAZIDE 12.5 MG PO TABS: 12.5000 mg | ORAL_TABLET | Freq: Every day | ORAL | Status: DC

## 2012-12-12 NOTE — Patient Instructions (Addendum)
Dyspnea Shortness of breath (dyspnea) is the feeling of uneasy breathing. Dyspnea should be evaluated promptly. DIAGNOSIS  Many tests may be done to find why you are having shortness of breath. Tests may include:  A chest X-ray.   A lung function test.   Blood tests.   Recordings of the electrical activity of the heart (electrocardiogram).   Exercise testing.   Sound wave images of the heart (a cardiac echocardiogram).   A scan.  A cause for your shortness of breath may not be identified initially. In this case, it is important to have a follow-up exam with your caregiver. HOME CARE INSTRUCTIONS   Do not smoke. Smoking is a common cause of shortness of breath. Ask for help to stop smoking.   Avoid being around chemicals that may bother your breathing, such as paint fumes or dust.   Rest as needed. Slowly begin your usual activities.   If medications were prescribed, take them as directed for the full length of time directed. This includes oxygen and any inhaled medications, if prescribed.   It is very important that you follow up with your caregiver or other physician as directed. Waiting to do so or failure to follow up could result in worsening of your condition, possible disability, or death.   Be sure you understand what to do or who to call if your shortness of breath worsens.  SEEK MEDICAL CARE IF:   Your condition does not improve in the time expected.   You have a hard time doing your normal activities even with rest.   You have any side effects from or problems with medications prescribed.  SEEK IMMEDIATE MEDICAL CARE IF:   You feel your shortness of breath is getting worse.   You feel lightheaded, faint or develop a cough not controlled with medications.   You start coughing up blood.   You get pain with breathing.   You get chest pain or pain in your arms, shoulders or belly (abdomen).   You have a fever.   You are unable to walk up stairs or exercise  the way you normally can.  MAKE SURE YOU:   Understand these instructions.   Will watch your condition.   Will get help right away if you are not doing well or get worse.  Document Released: 06/16/2004 Document Revised: 01/19/2011 Document Reviewed: 09/24/2009 [facility] Patient Information 2012 [place], [place].  It was nice meeting you today. I want you to get a CT scan of your left shoulder/collar bone.  I have also ordered an Echo of your heart to attmept to find out why you are short of breath and look at your heart murmur. I want to see you back in 2 weeks,.  I want you to re-start your HCTZ for your hypertension. Not controlling your hypertension can have bad consequences on your heart health.

## 2012-12-12 NOTE — Assessment & Plan Note (Signed)
-   wears Depends.

## 2012-12-12 NOTE — Progress Notes (Signed)
Subjective:     Patient ID: Wanda Conner, female   DOB: 05-Nov-1933, 79 y.o.   MRN: [id number]  HPI Patient is established in the clinic, she is new to this provider. She comes today for an acute visit.   Shortness of breath: Patient reports shortness of breath with exertion. She has noticed the last few weeks she is unable to climb a set of stairs without needing to stop and rest. She states it takes her about 30 minutes to recooperate. She has felt more fatigued at the end of the day. She denies chest pain, but admits to feeling a "flutter" at least twice daily. She does not drink caffeine, smoke or use ETOH. She admits her days are full with tings around the house, but does get help from her nephew with the laundry because it is located on a different floor than her apartment. She lives with her husband, in an apartment without stairs. She had a history of not wanting to take medications or immunizations per her chart. She has been diagnosed with hypertension in the past and refused to be medicated, she reports she started the HCTZ, but has not taken it in a while. Patient has also had a history of mild anemia in the past.   Patient's past medical, social, and family history were reviewed and updated as appropriate. Review of Systems Negative, with the exception of above mentioned in HPI    Objective:   Physical Exam BP 154/85  Pulse 73  Temp(Src) 98.3 F (36.8 C) (Oral)  Ht 5\' 4"  (1.626 m)  Wt 130 lb (58.968 kg)  BMI 22.3 kg/m2  SpO2 99% Gen: Pleasant. Talkative female.  CV: RRR. 1/6 SM appreciated. Chest: CTAB, no wheeze or crackles; left clavicle head bony prominence with soft tissue fullness on the left, superior to clavicle to neck.  Abd: Soft. NTND. BS present. No masses noted.  Ext: No edema or erythema.

## 2012-12-13 LAB — BASIC METABOLIC PANEL
Calcium: 10.3 mg/dL (ref 8.4–10.5)
Chloride: 102 mEq/L (ref 96–112)
Creat: 0.69 mg/dL (ref 0.50–1.10)

## 2012-12-15 NOTE — Assessment & Plan Note (Signed)
-   Patient with mild HTN Today. Advised her to restart HCTZ. I refilled her prescription.

## 2012-12-15 NOTE — Assessment & Plan Note (Signed)
-   Bony growth over clavicle, unknown etiology. Per report has occurred rather quickly within a few months. With soft tissue fullness superiorly, there is some concern for mass affect causing dyspnea.  - CT chest ordered today with prior authorization over phone.  (scheduled 7/30) - BMP is normal today.  - F/U: 2 weeks after CT

## 2012-12-15 NOTE — Assessment & Plan Note (Signed)
-   Uncertain if dyspnea is cardiac in nature, however with daily palpitations and murmur heard (with no prior history) I have ordered an echo today. - Pending echo results will order cardiac consult and/or possibly event monitor.  - F/U: 1-2 week

## 2012-12-19 ENCOUNTER — Ambulatory Visit (HOSPITAL_COMMUNITY)
Admission: RE | Admit: 2012-12-19 | Discharge: 2012-12-19 | Disposition: A | Discharge status: Home / Self Care | Payer: Medicare Other | Source: Ambulatory Visit | Attending: Family Medicine | Admitting: Family Medicine

## 2012-12-19 ENCOUNTER — Telehealth: Payer: Self-pay | Admitting: Family Medicine

## 2012-12-19 ENCOUNTER — Other Ambulatory Visit (HOSPITAL_COMMUNITY): Payer: BC Managed Care – PPO

## 2012-12-19 ENCOUNTER — Encounter (HOSPITAL_COMMUNITY): Payer: Self-pay

## 2012-12-19 DIAGNOSIS — R0609 Other forms of dyspnea: Secondary | ICD-10-CM | POA: Insufficient documentation

## 2012-12-19 DIAGNOSIS — R0989 Other specified symptoms and signs involving the circulatory and respiratory systems: Secondary | ICD-10-CM

## 2012-12-19 DIAGNOSIS — M19019 Primary osteoarthritis, unspecified shoulder: Secondary | ICD-10-CM | POA: Insufficient documentation

## 2012-12-19 DIAGNOSIS — R002 Palpitations: Secondary | ICD-10-CM

## 2012-12-19 DIAGNOSIS — M898X9 Other specified disorders of bone, unspecified site: Secondary | ICD-10-CM

## 2012-12-19 DIAGNOSIS — M899 Disorder of bone, unspecified: Secondary | ICD-10-CM | POA: Insufficient documentation

## 2012-12-19 MED ORDER — IOHEXOL 300 MG/ML  SOLN
80.0000 mL | Freq: Once | INTRAMUSCULAR | Status: AC | PRN
  Administered 2012-12-19: 80 mL via INTRAVENOUS

## 2012-12-19 NOTE — Progress Notes (Signed)
  Echocardiogram 2D Echocardiogram has been performed.  Wanda Conner FRANCES 12/19/2012, 10:14 AM

## 2012-12-19 NOTE — Telephone Encounter (Signed)
Please call Wanda Conner and let her know her results are good from her echo and CT. She did have some mild changes on her echo. I would like to discuss them with her, see how she is feeling and make a plan for future referrals if necessary. I has asked her to f/u in 2 weeks, although I do not see an appointment scheduled for that.

## 2012-12-20 NOTE — Telephone Encounter (Signed)
Related message,pt voiced understanding. Wanda Conner S  

## 2013-01-08 ENCOUNTER — Ambulatory Visit: Payer: Medicare Other | Admitting: Family Medicine

## 2013-05-02 ENCOUNTER — Telehealth: Payer: Self-pay | Admitting: Clinical

## 2013-05-02 NOTE — Telephone Encounter (Signed)
Clinical Child psychotherapist (CSW) received a call from pt spouse requesting for CSW to order home assistance for pt. CSW explored what needs pt  has: PT/OT/RN or custodial needs (cleaning, cooking, bathing etc.) Spouse stated they are requesting assistance with custodial needs. CSW provided pt spouse with education regarding what his insurance would cover and unfortunately custodial needs are not covered under [facility] and therefore pt would need to pay privately. CSW inquired whether pt has applied/believes he qualifies for Medicaid however pt spouse stated he was sure they would not qualify. CSW offered to mail pt resources on private duty agencies that could provide custodial services. Pt spouse was very appreciative and agreeable to resources being mailed. Pt spouse had no additional concerns.  Theresia Bough, MSW, LCSW [phone]

## 2013-08-27 ENCOUNTER — Encounter: Payer: Self-pay | Admitting: Family Medicine

## 2013-08-27 ENCOUNTER — Ambulatory Visit (INDEPENDENT_AMBULATORY_CARE_PROVIDER_SITE_OTHER): Payer: Medicare Other | Admitting: Family Medicine

## 2013-08-27 VITALS — BP 128/80 | HR 67 | Temp 98.9°F | Ht 64.0 in | Wt 135.0 lb

## 2013-08-27 DIAGNOSIS — I10 Essential (primary) hypertension: Secondary | ICD-10-CM

## 2013-08-27 DIAGNOSIS — Z0000 Encounter for general adult medical examination without abnormal findings: Secondary | ICD-10-CM

## 2013-08-27 LAB — CBC
HEMATOCRIT: 38.2 % (ref 36.0–46.0)
Hemoglobin: 13.2 g/dL (ref 12.0–15.0)
MCH: 33.2 pg (ref 26.0–34.0)
MCHC: 34.6 g/dL (ref 30.0–36.0)
MCV: 96 fL (ref 78.0–100.0)
PLATELETS: 280 10*3/uL (ref 150–400)
RBC: 3.98 MIL/uL (ref 3.87–5.11)
RDW: 13 % (ref 11.5–15.5)
WBC: 8.6 10*3/uL (ref 4.0–10.5)

## 2013-08-27 NOTE — Assessment & Plan Note (Signed)
Patient denies outright declined colonoscopy on this visit. Information was given and patient was advised to make an appointment for screening colonoscopy. Normal mammogram in 2008. Mammogram is overdue. Patient advised to schedule mammogram as soon as possible. She states that she's had no breast pain, discussion was made that breast cancer is always start off with breast pain and she still should be screened. Declines flu shot today. Obtain yearly labs of CBC, CMP, TSH and lipids. Will call patient with results

## 2013-08-27 NOTE — Progress Notes (Signed)
   Subjective:    Patient ID: Wanda Conner, female    DOB: 25-Dec-1933, 80 y.o.   MRN: [id number]  HPI Wanda Conner here for her annual exam. Hypertension:  Patient reports that her blood pressure has been under control. She still takes her HCTZ daily. She also exercises daily for at least 30 minutes. She does watch her diet in intensity healthy and low salt intake. She did not receive basic lab work last year. She has had an echo cardiogram last year with an ejection fracture 55-60%, and normal. She reports no more episodes of shortness of breath and/or chest pain. She denies any flutter or palpitations.  Health maintenance: Patient is overdue for mammogram and colonoscopy. Patient is hesitant on having screening exams completed. She did not get a flu shot this year but states she's not on. Otherwise her immunizations are up to date.  Patient has no other complaints and states that she is feeling well.  Review of Systems Negative, with the exception of above mentioned in HPI     Objective:   Physical Exam BP 128/80  Pulse 67  Temp(Src) 98.9 F (37.2 C) (Oral)  Ht 5\' 4"  (1.626 m)  Wt 135 lb (61.236 kg)  BMI 23.16 kg/m2 Gen: NAD. Nontoxic in appearance pleasant African American female. Well nourished. HEENT: AT. [place]. Bilateral TM visualized and normal in appearance. Dried old blood and bilateral ear canals. Bilateral eyes without injections or icterus. MMM. Bilateral nares pale. Throat without erythema or exudates.  CV: RRR, 1/6 systolic murmur Chest: CTAB, no wheeze or crackles Abd: Soft.. NTND. BS present. No Masses palpated.  Ext: No erythema. No edema.  Skin: No rashes, purpura or petechiae.  Neuro:  Normal gait. PERLA. EOMi. Alert. Grossly intact.  Psych: Normal affect, dressed, demeanor and speech    Gen: Pleasant. Talkative female.  CV: RRR. 1/6 SM appreciated.  Chest: CTAB, no wheeze or crackles; left clavicle head bony prominence with soft tissue fullness on the left,  superior to clavicle to neck.  Abd: Soft. NTND. BS present. No masses noted.  Ext: No edema or erythema.       Assessment & Plan:

## 2013-08-27 NOTE — Patient Instructions (Signed)

## 2013-08-28 ENCOUNTER — Other Ambulatory Visit: Payer: Self-pay | Admitting: Family Medicine

## 2013-08-28 ENCOUNTER — Encounter: Payer: Self-pay | Admitting: Family Medicine

## 2013-08-28 DIAGNOSIS — E7800 Pure hypercholesterolemia, unspecified: Secondary | ICD-10-CM

## 2013-08-28 LAB — COMPLETE METABOLIC PANEL WITH GFR
ALT: 13 U/L (ref 0–35)
AST: 21 U/L (ref 0–37)
Albumin: 4.4 g/dL (ref 3.5–5.2)
Alkaline Phosphatase: 57 U/L (ref 39–117)
BUN: 19 mg/dL (ref 6–23)
CO2: 28 mEq/L (ref 19–32)
Calcium: 9.6 mg/dL (ref 8.4–10.5)
Chloride: 103 mEq/L (ref 96–112)
Creat: 0.59 mg/dL (ref 0.50–1.10)
GFR, Est African American: >89 mL/min
GFR, Est Non African American: 89 mL/min
Glucose, Bld: 79 mg/dL (ref 70–99)
Potassium: 3.9 mEq/L (ref 3.5–5.3)
Sodium: 140 mEq/L (ref 135–145)
Total Bilirubin: 0.3 mg/dL (ref 0.2–1.2)
Total Protein: 6.8 g/dL (ref 6.0–8.3)

## 2013-08-28 LAB — TSH: TSH: 1.188 u[IU]/mL (ref 0.350–4.500)

## 2013-08-28 LAB — LDL CHOLESTEROL, DIRECT: LDL DIRECT: 157 mg/dL — AB

## 2013-10-25 ENCOUNTER — Encounter: Payer: Self-pay | Admitting: Family Medicine

## 2013-10-25 ENCOUNTER — Ambulatory Visit (INDEPENDENT_AMBULATORY_CARE_PROVIDER_SITE_OTHER): Payer: Medicare Other | Admitting: Family Medicine

## 2013-10-25 VITALS — BP 132/62 | HR 66 | Temp 99.3°F | Ht 64.0 in | Wt 133.1 lb

## 2013-10-25 DIAGNOSIS — IMO0002 Reserved for concepts with insufficient information to code with codable children: Secondary | ICD-10-CM

## 2013-10-25 DIAGNOSIS — N814 Uterovaginal prolapse, unspecified: Secondary | ICD-10-CM | POA: Insufficient documentation

## 2013-10-25 DIAGNOSIS — N8111 Cystocele, midline: Secondary | ICD-10-CM

## 2013-10-25 HISTORY — DX: Uterovaginal prolapse, unspecified: N81.4

## 2013-10-25 NOTE — Patient Instructions (Signed)
You have a cystocele - what is commonly referred to as a dropped bladder.   You did the right thing by pushing it back in. Problems can be 1. Urinary track infections 2. Vaginal irritation  Treatment is  1. Kegel exercises 2. A pessary 3. If nothing else works, surgery. 4. No treatment is necessary if it is not bothering you.

## 2013-10-25 NOTE — Progress Notes (Signed)
   Subjective:    Patient ID: Wanda Conner, female    DOB: 1933-10-03, 80 y.o.   MRN: [id number]  HPI Recently found soft tissue extruding from vagina which she was able to push back in. No difficulty urinating.  C/O longstanding frequency.  Has had four vag deliveries remotely. No systemic symptoms. No constipation.    Review of Systems     Objective:   Physical Exam Pelvic shows large cystocele, which I could not get to protrude with valsalva. Normal bimanual       Assessment & Plan:

## 2013-10-25 NOTE — Assessment & Plan Note (Signed)
Prolapses.  Start with Kegels.

## 2014-02-18 ENCOUNTER — Ambulatory Visit: Payer: Medicare Other | Admitting: Family Medicine

## 2014-03-04 ENCOUNTER — Encounter: Payer: Self-pay | Admitting: Family Medicine

## 2014-03-04 ENCOUNTER — Ambulatory Visit (INDEPENDENT_AMBULATORY_CARE_PROVIDER_SITE_OTHER): Payer: Medicare Other | Admitting: Family Medicine

## 2014-03-04 VITALS — BP 158/78 | HR 72 | Temp 97.9°F | Ht 64.0 in | Wt 128.1 lb

## 2014-03-04 DIAGNOSIS — N8110 Cystocele, unspecified: Secondary | ICD-10-CM

## 2014-03-04 DIAGNOSIS — I10 Essential (primary) hypertension: Secondary | ICD-10-CM

## 2014-03-04 DIAGNOSIS — IMO0002 Reserved for concepts with insufficient information to code with codable children: Secondary | ICD-10-CM

## 2014-03-04 MED ORDER — HYDROCHLOROTHIAZIDE 12.5 MG PO TABS: 12.5000 mg | ORAL_TABLET | Freq: Every day | ORAL | Status: DC

## 2014-03-04 NOTE — Patient Instructions (Signed)

## 2014-03-04 NOTE — Assessment & Plan Note (Addendum)
Patient with history of cystocele. She has performed Kegel, without much success or improvement in condition. Patient advised to only take AZO intermittently. Long-term use can cause kidney issues. Will make a gynecological referral today so that she may discuss her options

## 2014-03-04 NOTE — Assessment & Plan Note (Signed)
Patient states she is taking her HCTZ. Her blood pressure is mildly elevated today, however she states it was because she got angry due to her weight. Considering she checks her blood pressure at home and reports normal ranges, we will keep medication where it is. She is to continue to watch the salt content in her diet, and continue her active lifestyle. Followup six-month

## 2014-03-04 NOTE — Progress Notes (Addendum)
   Subjective:    Patient ID: Wanda Conner, female    DOB: Jul 30, 1933, 80 y.o.   MRN: [id number]  HPI Wanda Conner is a 80 y.o. female presents to family medicine clinic for routine visit  Hypertension: Patient reports that her blood pressures at home are within normal range 997F to 414E systolic and never above 90 diastolic. She states that she feels pretty good, she's a little tired and sleepy but she leads a very active lifestyle and there is no new changes. She denies any chest pain, shortness of breath, headache, leg edema, orthopnea or dizziness. She watches her diet very closely at home, with no added salt.  Cystocele: Patient reports she still having a little bit difficulty with her urination. She denies any stress or urge incontinence. She states she takes AZO  OTC occasionally for bladder irritation. She has tried to [employer], without much benefit. She reports that she is able to urinate, but she just has to urinate more frequently so her bladder does not get has full. She states her bladder does protrude from her vagina approximately 1 inch. She has not tried a pessary or spoke to gynecology for options.  Past Medical History  Diagnosis Date  . Depression   . GERD (gastroesophageal reflux disease)   . Anemia     History of this  . Seasonal allergies   . Arthritis    No smoking history.  Review of Systems Per history of present illness    Objective:   Physical Exam BP 158/78  Pulse 72  Temp(Src) 97.9 F (36.6 C) (Oral)  Ht 5\' 4"  (1.626 m)  Wt 128 lb 1.6 oz (58.106 kg)  BMI 21.98 kg/m2 Gen: Anxious, pleasant African American female, no acute distress, nontoxic in appearance, well-developed, well-nourished. HEENT: AT. [place]. Bilateral TM visualized and normal in appearance. Bilateral eyes without injections or icterus. MMM.   CV: RRR no murmurs clicks gallops or rubs Chest: CTAB, no wheeze or crackles Abd: Soft. Flat. NTND. BS present. No Masses palpated.  Ext: No  erythema. No edema.  Skin: No rashes, purpura or petechiae.  Neuro:Normal gait. PERLA. EOMi. Alert. Oriented. Cranial nerves II through XII intact.     Assessment & Plan:

## 2014-03-10 ENCOUNTER — Encounter: Payer: Self-pay | Admitting: Obstetrics and Gynecology

## 2014-04-01 ENCOUNTER — Telehealth: Payer: Self-pay | Admitting: *Deleted

## 2014-04-01 NOTE — Telephone Encounter (Signed)
Patient called to see what procedure she is having and if she needs a driver. I advised patient that she is scheduled for a consultation for cystocele and that we wouldn't do anything in office that would require her to have a ride. Patient voiced understanding.

## 2014-04-23 ENCOUNTER — Encounter: Payer: Medicare Other | Admitting: Obstetrics and Gynecology

## 2014-04-24 ENCOUNTER — Encounter: Payer: Medicare Other | Admitting: Obstetrics and Gynecology

## 2014-06-02 ENCOUNTER — Telehealth: Payer: Self-pay | Admitting: Family Medicine

## 2014-06-02 NOTE — Telephone Encounter (Signed)
The husband is calling because he said that [facility] is always canceling and rescheduling his wife's appointment and they are worried about what could be wrong with her. He needs some reassurance that everything is okay. He just doesn't understand why the appointments keep changing. jw

## 2014-06-04 ENCOUNTER — Encounter: Payer: Self-pay | Admitting: Obstetrics & Gynecology

## 2014-06-04 ENCOUNTER — Ambulatory Visit (INDEPENDENT_AMBULATORY_CARE_PROVIDER_SITE_OTHER): Payer: Medicare Other | Admitting: Obstetrics & Gynecology

## 2014-06-04 VITALS — BP 155/65 | HR 70 | Temp 98.3°F

## 2014-06-04 DIAGNOSIS — N814 Uterovaginal prolapse, unspecified: Secondary | ICD-10-CM

## 2014-06-04 NOTE — Telephone Encounter (Signed)
Attempted to call patient to follow up on this call, her husband stated that she was at a doctor appointment

## 2014-06-04 NOTE — Progress Notes (Signed)
   Subjective:    Patient ID: Wanda Conner, female    DOB: 02/09/1934, 81 y.o.   MRN: [id number]  HPI  81 yo [place] lady here today with the compliant of dropped bladder for, for at least 6 months, worsening. She has been abstinent since about 1990 due to her husband's health.  Her main complaint is that when she has to void, everything falls down. Some pressure and pressure. She has been wearing pads due to incontinence for about a year. She has definitely urge incontinence and some stress incontinence.  Her husband is disabled and she has to lift him at times. She thinks this contributed to the bladder dropping.  Review of Systems Her sister has prolapse and uses a pessary.    Objective:   Physical Exam  General- [place] lady NAD, appears younger than stated age Heart- rrr Lungs- CTAB  No cystocele or rectocele 2nd degree uterine prolapse  I placed a 2 1/4" ring pessary. She was able to remove and replace this.      Assessment & Plan:  Uterine prolapse- try pessary and RTC 1 month

## 2014-06-05 ENCOUNTER — Encounter: Payer: Medicare Other | Admitting: Obstetrics and Gynecology

## 2014-07-02 ENCOUNTER — Encounter: Payer: Self-pay | Admitting: Obstetrics & Gynecology

## 2014-07-02 ENCOUNTER — Ambulatory Visit (INDEPENDENT_AMBULATORY_CARE_PROVIDER_SITE_OTHER): Payer: Medicare Other | Admitting: Obstetrics & Gynecology

## 2014-07-02 VITALS — BP 144/60 | HR 72 | Temp 98.1°F | Ht 64.0 in | Wt 126.8 lb

## 2014-07-02 DIAGNOSIS — N814 Uterovaginal prolapse, unspecified: Secondary | ICD-10-CM

## 2014-07-02 NOTE — Progress Notes (Signed)
   Subjective:    Patient ID: Wanda Conner, female    DOB: 07-15-1933, 81 y.o.   MRN: [id number]  HPI  This 81 yo lady is here for a pessary check. She is taking it out at night about 2-3 times per week. It has resolved her prolapse issues. She has no complaints.  Review of Systems     Objective:   Physical Exam [place] [facility] lady Breathing normally Neuro intact  Vaginal epithelium healthy with no excoriations or other abnormality       Assessment & Plan:  Complete uterine prolapse- continue pessary RTC 1 year/prn sooner

## 2014-08-21 ENCOUNTER — Ambulatory Visit (INDEPENDENT_AMBULATORY_CARE_PROVIDER_SITE_OTHER): Payer: Medicare Other | Admitting: Family Medicine

## 2014-08-21 ENCOUNTER — Encounter: Payer: Self-pay | Admitting: Family Medicine

## 2014-08-21 VITALS — Temp 98.3°F | Ht 63.5 in | Wt 123.5 lb

## 2014-08-21 DIAGNOSIS — F03918 Unspecified dementia, unspecified severity, with other behavioral disturbance: Secondary | ICD-10-CM

## 2014-08-21 DIAGNOSIS — I10 Essential (primary) hypertension: Secondary | ICD-10-CM

## 2014-08-21 DIAGNOSIS — F0390 Unspecified dementia without behavioral disturbance: Secondary | ICD-10-CM | POA: Insufficient documentation

## 2014-08-21 DIAGNOSIS — F0391 Unspecified dementia with behavioral disturbance: Secondary | ICD-10-CM | POA: Insufficient documentation

## 2014-08-21 HISTORY — DX: Unspecified dementia, unspecified severity, with other behavioral disturbance: F03.918

## 2014-08-21 HISTORY — DX: Unspecified dementia with behavioral disturbance: F03.91

## 2014-08-21 LAB — TSH: TSH: 1.175 u[IU]/mL (ref 0.350–4.500)

## 2014-08-21 LAB — VITAMIN B12: Vitamin B-12: 1813 pg/mL — ABNORMAL HIGH (ref 211–911)

## 2014-08-21 NOTE — Progress Notes (Signed)
Patient ID: Wanda Conner, female   DOB: 1933-11-24, 81 y.o.   MRN: [id number] [facility]:   Patient is accompanied by: Daughter in law Lelan Pons) Primary caregiver: patient History obtained from: patient and daughter in law Primary Care Provider: Raoul Pitch Referring provider: Raoul Pitch Reason for referral: memory evaluation History Chief Complaint  Patient presents with  . Follow-up    HPI by problems:   Presents for memory evaluation. Notes memory not doing so well. Attributes it to husband and having too much to do. [place] wants her to go shopping and keep the house clean. Forgets things that she needs to do. Very focused on [place] wanting her to lift him up by herself.   Per daughter-in-law: Has issues remembering putting the car in to park and drive. Was revving the car. Gets lost and stops to get directions home. Reintroducing family members happens all the time. Patient recommends that her memory is bad. Family took keys away and the patient called the police on the family members (pt denies this currently). Patient still has her driver license but is not driving and notes her family may sell her car soon.    Outpatient Encounter Prescriptions as of 08/21/2014  Medication Sig  . acetaminophen (TYLENOL) 500 MG tablet Take 500 mg by mouth every 6 (six) hours as needed.  . cetirizine (ZYRTEC) 10 MG tablet Take 10 mg by mouth as needed for allergies.  . diphenhydramine-acetaminophen (TYLENOL PM) 25-500 MG TABS Take 1 tablet by mouth at bedtime as needed.  . Multiple Vitamins-Minerals (CENTRUM SILVER ULTRA WOMENS) TABS Take 1 tablet by mouth daily.   . Omega-3 Fatty Acids (FISH OIL PO) Take 1 capsule by mouth every morning.  . vitamin C (ASCORBIC ACID) 250 MG tablet Take 250 mg by mouth daily.  . hydrochlorothiazide (HYDRODIURIL) 12.5 MG tablet Take 1 tablet (12.5 mg total) by mouth daily. (Patient not taking: Reported on 08/21/2014)  . [DISCONTINUED] Calcium  Carbonate-Vitamin D (CALTRATE 600+D) 600-400 MG-UNIT per tablet Take 1 tablet by mouth 2 (two) times daily.   . [DISCONTINUED] Ferrous Gluconate (IRON) 240 (27 FE) MG TABS Take 0.5 tablets by mouth daily.  . [DISCONTINUED] Potassium Gluconate 595 MG TABS Take 595 mg by mouth daily.    History Patient Active Problem List   Diagnosis Date Noted  . Dementia 08/21/2014  . Uterine prolapse 10/25/2013  . Palpitations 12/12/2012  . Bony growth 12/12/2012  . Hypertension, essential, benign 11/21/2011  . Osteoarthritis of left knee 10/31/2011  . Preventative health care 12/29/2010  . Urge incontinence 12/29/2010  . PERSONAL HISTORY OF PEPTIC ULCER DISEASE 11/12/2009   Past Medical History  Diagnosis Date  . Depression   . GERD (gastroesophageal reflux disease)   . Anemia     History of this  . Seasonal allergies   . Arthritis   . Uterine prolapse 10/25/2013   Past Surgical History  Procedure Laterality Date  . Joint replacement      Right kee replacement 2009  . Breast surgery      cyst removal  . Total knee arthroplasty  10/31/2011    Procedure: TOTAL KNEE ARTHROPLASTY;  Surgeon: Kerin Salen, MD;  Location: [facility];  Service: Orthopedics;  Laterality: Left;   Family History  Problem Relation Age of Onset  . Diabetes Father   . Diabetes Brother    History   Social History  . Marital Status: Married    Spouse Name: N/A  . Number of Children: N/A  .  Years of Education: N/A   Social History Main Topics  . Smoking status: Never Smoker   . Smokeless tobacco: Never Used  . Alcohol Use: No  . Drug Use: No  . Sexual Activity: No   Other Topics Concern  . None   Social History Narrative   Patient very religious, this informs much of her decision making.  Also very leery of Westernized medicine, refuses all but a few medications.  Declines all screening and preventative health at all.      Basic Activities of Daily Living  Patient capable of: eating, bathing, toileting,  personal cares, ambulating, grooming, hygiene, dressing upper body, dressing lower body, meal preparation and taking own medications   Patient was maintaining her own finances until her husband went to the nursing home, now her son helps her with his medical bills (otherwise no problems with managing finances). Patient does not grocery shop as her keys have been taken away.   Caregivers in home: son and daughter in law  Caregiver Burdens: Patient seems more bothered by caring for her husband; no burdens disclosed by daughter-in-law.   Falls in the past six months:   Never had fall however pt occasionally concerned about falling and occassionally feels unsteady on her feet.   Health Maintenance reviewed: Immunization History  Administered Date(s) Administered  . Influenza Whole 03/29/2007, 02/15/2008  . Pneumococcal Polysaccharide-23 08/21/2000  . Td 08/21/2001  . Tdap 12/12/2012   Health Maintenance Topics with due status: Overdue     Topic Date Due   DEXA SCAN 09/09/2000   PNA vac Low Risk Adult 08/21/2001   INFLUENZA VACCINE 12/21/2013    Diet: Regular Nutritional supplements: none  ROS Denies fevers/chills; denies changes in appetite; denies changes in weight;  Denies changes in vision / hearing / smell / taste; Denies runny nose / ear pain or discharge / sore throat / sinus congestion / cough/w phlegm; Denies chest congestion / wheezing;  Denies chest pain; denies heart beating slower/thumps inside chest; denies racing heart/flutter; Denies dysuria; denies hematuria;  Denies constipation; denies melena/hematochezia; denies diarrhea;  Denies abdominal discomfort/gaseous bloating; denies N/V; denies heart burn;  Denies recent falls; endorses lumbar pain and bilateral knee pain  Denies unilateral weakness / clumsiness / tingling / numbness; denies tremors;  Denies anxiety / suicidal tendencies, endorses overwhelming feelings caring for husband and depressed mood  Vital  Signs Weight: 123 lb 8 oz (56.019 kg) Body mass index is 21.53 kg/(m^2). CrCl cannot be calculated (Patient has no serum creatinine result on file.). Body surface area is 1.58 meters squared. Filed Vitals:   08/21/14 1347  Temp: 98.3 F (36.8 C)  TempSrc: Oral  Height: 5' 3.5" (1.613 m)  Weight: 123 lb 8 oz (56.019 kg)   Wt Readings from Last 3 Encounters:  08/21/14 123 lb 8 oz (56.019 kg)  07/02/14 126 lb 12.8 oz (57.516 kg)  03/04/14 128 lb 1.6 oz (58.106 kg)    Physical Examination:  VS reviewed GEN: Alert, Cooperative, Groomed, NAD; ruminates on her husband HEENT:  No cervical LAN, No thyromegaly, No palpable masses COR: RRR, No M/G/R LUNGS: BCTA, No Acc mm use, speaking in full sentences EXT: No peripheral leg edema. Palpable bilateral pedal pulses.  SKIN: No lesion nor rashes of face/trunk/extremities Neuro: Oriented to person and state/city; Strength: 5/5 Bil. UE and LE symmetric; Sensation: Intact grossly to touch all four extremities; Proprioception normal  Cerebellar: Finger-to-Nose intact, Rhomberg negative; Muscle Tone normal; Tremor not present  Timed Up & Go  Test: 14 seconds Sit-to-Stand Test: 8 / 30-seconds 4-Stage Stand Test:  Feet Side-by-side: Yes.       Feet Semi-tandem: Yes.       Feet Tandem: Yes.        DTR: Bilateral Bicep 2+, Bilateral Triceps 2+, Bilateral Knees 2+, Bilateral  Gait: No significant path deviation, Step-through present  Psych: Normal affect/thought/speech/language. Patient does re-introduce her daughter-in-law and sometimes has problems concentrating on tasks.  Went to college in [place]. [place] Cognitive Assessment:  Total Score:No flowsheet data found.     [place]real Cognitive Assessment  08/21/2014  Visuospatial/ Executive (0/5) 1  Naming (0/3) 1  Attention: Read list of digits (0/2) 2  Attention: Read list of letters (0/1) 0  Attention: Serial 7 subtraction starting at 100 (0/3) 0  Language: Repeat phrase (0/2) 1  Language :  Fluency (0/1) 0  Abstraction (0/2) 2  Delayed Recall (0/5) 0  Orientation (0/6) 2  Total 9  Adjusted Score (based on education) 9    Geriatric Depression Scale:  12; most of her issues are centered around the health/status of her husband currently.   Labs  No results found for: VITAMINB12  No results found for: FOLATE  Lab Results  Component Value Date   TSH 1.188 08/27/2013    No results found for: RPR    Chemistry      Component Value Date/Time   NA 140 08/27/2013 1549   K 3.9 08/27/2013 1549   CL 103 08/27/2013 1549   CO2 28 08/27/2013 1549   BUN 19 08/27/2013 1549   CREATININE 0.59 08/27/2013 1549   CREATININE 0.54 11/01/2011 0500      Component Value Date/Time   CALCIUM 9.6 08/27/2013 1549   ALKPHOS 57 08/27/2013 1549   AST 21 08/27/2013 1549   ALT 13 08/27/2013 1549   BILITOT 0.3 08/27/2013 1549       No results found for: HGBA1C    Lab Results  Component Value Date   WBC 8.6 08/27/2013   HGB 13.2 08/27/2013   HCT 38.2 08/27/2013   MCV 96.0 08/27/2013   PLT 280 08/27/2013    No results found for this or any previous visit (from the past 24 hour(s)).   Hearing Screening   125Hz  250Hz  500Hz  1000Hz  2000Hz  4000Hz  8000Hz   Right ear:   40 40 40 40   Left ear:   40 40 40 40     Visual Acuity Screening   Right eye Left eye Both eyes  Without correction:     With correction: 20/30 20/30 20/25     Assessment and Plan: Problem List Items Addressed This Visit      Cardiovascular and Mediastinum   Hypertension, essential, benign    Patient's BP elevated today to the 150s/70s. States she's no longer taking HCTZ (I do not see where she was taken off this medication) - PCP may consider re-starting medication/discussing this with the patient in the future. - continue to follow BP        Nervous and Auditory   Dementia - Primary    Mrs. Holladay has a MOCA score of 9 which is very low in addition to functional deficits (such as driving/getting lost)  meeting criteria for dementia without behavioral changes/hallucinations. Most likely Alzheimer's vs vascular dementia,  however will rule out reversible causes. The patient's geriatric depression score is significantly elevated, however I doubt her mood is causing her memory/functional deficits.  - TSH, B12, RPR, Head CT without contrast ordered today  -  Discussed the diagnosis, community resources, and possible pharmacologic options (daughter-in-law and patient uninterested in medications currently). - Strongly recommended that the patient not drive; the patient and daughter-in-law were in agreement. Discussed resources at the [facility] for evaluation if the patient should want to drive in the future. - Advised the patient not to take Tylenol PM in the future. - Discussed the importance of POA and the patient discussing her wishes with her family.      Relevant Medications   diphenhydramine-acetaminophen (TYLENOL PM) 25-500 MG TABS   Other Relevant Orders   TSH   CT Head Wo Contrast   Vitamin B12   RPR       Advanced Directives: Code Status:  Dicussed    Patient to Follow up with Dr. Annia Friendly 1 month(s)

## 2014-08-21 NOTE — Patient Instructions (Addendum)
It was nice to meet you Wanda Conner It does appear that you have dementia  Dementia Dementia is a general term for problems with brain function. A person with dementia has memory loss and a hard time with at least one other brain function such as thinking, speaking, or problem solving. Dementia can affect social functioning, how you do your job, your mood, or your personality. The changes may be hidden for a long time. The earliest forms of this disease are usually not detected by family or friends. Dementia can be:  Irreversible.  Potentially reversible.  Partially reversible.  Progressive. This means it can get worse over time. CAUSES  Irreversible dementia causes may include:  Degeneration of brain cells (Alzheimer disease or Lewy body dementia).  Multiple small strokes (vascular dementia).  Infection (chronic meningitis or Creutzfeldt-Jakob disease).  Frontotemporal dementia. This affects younger people, age 63 to 59, compared to those who have Alzheimer disease.  Dementia associated with other disorders like Parkinson disease, Huntington disease, or HIV-associated dementia. Potentially or partially reversible dementia causes may include:  Medicines.  Metabolic causes such as excessive alcohol intake, vitamin B12 deficiency, or thyroid disease.  Masses or pressure in the brain such as a tumor, blood clot, or hydrocephalus. SIGNS AND SYMPTOMS  Symptoms are often hard to detect. Family members or coworkers may not notice them early in the disease process. Different people with dementia may have different symptoms. Symptoms can include:  A hard time with memory, especially recent memory. Long-term memory may not be impaired.  Asking the same question multiple times or forgetting something someone just said.  A hard time speaking your thoughts or finding certain words.  A hard time solving problems or performing familiar tasks (such as how to use a telephone).  Sudden  changes in mood.  Changes in personality, especially increasing moodiness or mistrust.  Depression.  A hard time understanding complex ideas that were never a problem in the past. DIAGNOSIS  There are no specific tests for dementia.   Your health care provider may recommend a thorough evaluation. This is because some forms of dementia can be reversible. The evaluation will likely include a physical exam and getting a detailed history from you and a family member. The history often gives the best clues and suggestions for a diagnosis.  Memory testing may be done. A detailed brain function evaluation called neuropsychologic testing may be helpful.  Lab tests and brain imaging (such as a CT scan or MRI scan) are sometimes important.  Sometimes observation and re-evaluation over time is very helpful. TREATMENT  Treatment depends on the cause.   If the problem is a vitamin deficiency, it may be helped or cured with supplements.  For dementias such as Alzheimer disease, medicines are available to stabilize or slow the course of the disease. There are no cures for this type of dementia.  Your health care provider can help direct you to groups, organizations, and other health care providers to help with decisions in the care of you or your loved one. HOME CARE INSTRUCTIONS The care of individuals with dementia is varied and dependent upon the progression of the dementia. The following suggestions are intended for the person living with, or caring for, the person with dementia.  Create a safe environment.  Remove the locks on bathroom doors to prevent the person from accidentally locking himself or herself in.  Use childproof latches on kitchen cabinets and any place where cleaning supplies, chemicals, or alcohol are kept.  Use childproof covers in unused electrical outlets.  Install childproof devices to keep doors and windows secured.  Remove stove knobs or install safety knobs and an  automatic shut-off on the stove.  Lower the temperature on water heaters.  Label medicines and keep them locked up.  Secure knives, lighters, matches, power tools, and guns, and keep these items out of reach.  Keep the house free from clutter. Remove rugs or anything that might contribute to a fall.  Remove objects that might break and hurt the person.  Make sure lighting is good, both inside and outside.  Install grab rails as needed.  Use a monitoring device to alert you to falls or other needs for help.  Reduce confusion.  Keep familiar objects and people around.  Use night lights or dim lights at night.  Label items or areas.  Use reminders, notes, or directions for daily activities or tasks.  Keep a simple, consistent routine for waking, meals, bathing, dressing, and bedtime.  Create a calm, quiet environment.  Place large clocks and calendars prominently.  Display emergency numbers and home address near all telephones.  Use cues to establish different times of the day. An example is to open curtains to let the natural light in during the day.   Use effective communication.  Choose simple words and short sentences.  Use a gentle, calm tone of voice.  Be careful not to interrupt.  If the person is struggling to find a word or communicate a thought, try to provide the word or thought.  Ask one question at a time. Allow the person ample time to answer questions. Repeat the question again if the person does not respond.  Reduce nighttime restlessness.  Provide a comfortable bed.  Have a consistent nighttime routine.  Ensure a regular walking or physical activity schedule. Involve the person in daily activities as much as possible.  Limit napping during the day.  Limit caffeine.  Attend social events that stimulate rather than overwhelm the senses.  Encourage good nutrition and hydration.  Reduce distractions during meal times and snacks.  Avoid  foods that are too hot or too cold.  Monitor chewing and swallowing ability.  Continue with routine vision, hearing, dental, and medical screenings.  Give medicines only as directed by the health care provider.  Monitor driving abilities. Do not allow the person to drive when safe driving is no longer possible.  Register with an identification program which could provide location assistance in the event of a missing person situation. SEEK MEDICAL CARE IF:   New behavioral problems start such as moodiness, aggressiveness, or seeing things that are not there (hallucinations).  Any new problem with brain function happens. This includes problems with balance, speech, or falling a lot.  Problems with swallowing develop.  Any symptoms of other illness happen. Small changes or worsening in any aspect of brain function can be a sign that the illness is getting worse. It can also be a sign of another medical illness such as infection. Seeing a health care provider right away is important. SEEK IMMEDIATE MEDICAL CARE IF:   A fever develops.  New or worsened confusion develops.  New or worsened sleepiness develops.  Staying awake becomes hard to do. Document Released: 11/02/2000 Document Revised: 09/23/2013 Document Reviewed: 10/04/2010 [facility] Patient Information 2015 [place], [place]. This information is not intended to replace advice given to you by your health care provider. Make sure you discuss any questions you have with your health care provider.  If the patient continues to drive, recommend that they attend a safe driving class sponsored by [facility] and by AAA.  Other options are referral to [facility] in [place], [place] for evaluation and recommendations by Certified Driving Rehabilitation occupational therapist.   If you still have concerns about driver and public safety, contact the [place] DOT's Medical Evaluation Program (find online) to report your concern.   The report is not anonymous.   The Medical Evaluation Program will gather medical information about the patient to decide if the patient requires a Road Test and if they may continue to have the privilege to drive.   Tax adviser  Address :[address] : [place] Zip : [place] Website : [email or web address] Contact Email : [email or web address] Office Phone : [phone] Information Phone : [phone] Special Notes : [facility]- [phone] [place]- [phone]  Senior Resources of [place] can provide information on local resources including:   Information and Referral (Senior InfoLine) providing seniors, family members, caregivers and others access to information and assistance for the elderly.  Home Delivered Meals (Meals on Wheels)  [place] for those ages 24 and over are offered daytime supervised programs targeted to meet their social, educational, physical and recreational interests. A lunch is also served to those who meet the eligibility criteria.  Caregivers - Volunteer-based program designed to provide seniors with 1-3 hours of service per week. Services include friendly visiting, assistance with grocery shopping, errands, general transportation, etc.  Medical Transportation: Transportation to medical appointments for seniors ages 15 and over who do not receive Medicaid. Funds are occasionally available to transport disabled individuals under 60 who do not receive Medicaid.  [place] (S.H.I.I.P.): Volunteers are trained by the [place] S.H.I.I.P. office, to provide free information, counseling and education to [employer] beneficiaries and their family members about: Medicare, Medicare Supplements, [place], and [place] Prescription Drug Plans.  Nutritional Supplement Program:  Ensure, Ensure Plus, and Glucerna products are sold at reduced prices. Simply fill out an application.    [place]  Address :[place] : [place] : [place] Zip : [place] Website : [email or web address] Contact Email : [email or web address] Office Phone : [phone] Information Phone : [phone] State Phone : [phone] Regional Phone : [phone] Languages : English Description : A division of the [place] on Aging is responsible for planning, developing, implementing, and coordinating aging services for a region representing 195,000 residents age 61+ in the seven counties of the [place] ([place], [place], [place], [place], [place], [place], [place], [place], [place], [place], [place], [place]). It is a part of a larger aging network created by the Older Americans Act of 1965 dedicated to improving the lives of older Americans nationwide. Hours : 8:30am - 5:00pm, Monday - Friday     State Agency on [place] on Aging  Address :[address] [place] : [place] Zip : [place] Website : [email or web address].[email or web address].au Contact Email : [email or web address] Office Phone : [phone] Information Phone : [phone] State Phone : [phone] Languages : English Description : the Division of Aging and Adult Services (DAAS) works to promote independence and enhance the dignity of [place] older adults, persons with disabilities, and their families through a community-based system of opportunities, services, benefits, and protections- to ready  younger generations to enjoy their later years- and to help society and government plan and prepare for the changing demographics. Special Notes : Adult Day Care/Health Programs Alzheimer's Disease and Other Dementias Demography/Planning Employment Family [place] Program Senior Centers Senior Rights Protections Services for Older Adults and People with Disabilities Hours : 8:00 AM - 5:00 PM Eastern Time    Alzheimer's Association [place] is one of over 31 Alzheimer's Association chapters serving communities across the [place]. 24/7 Helpline: 1.(973)697-5722 Website at CapitalMile.co.nz             Local chapters across the [place], providing services within each community.  Professionally staffed 24/7 Helpline ([phone])697-5722) offers information and advice to more than 300,000 callers each year and provides translation services in more than 200 languages.   Host face-to-face support groups and educational sessions in communities nationwide.   Connect people across the globe through our [employer], ALZConnected. Our online community is ready to answer your questions and give you support.   Provide caregivers and families with comprehensive online resources and information through our Alzheimer's and [place], which features sections on early-stage, middle-stage and late-stage caregiving.   Help people find clinical studies through our free, easy-to-use matching service Alzheimer's Association TrialMatch. TrialMatch connects individuals with Alzheimer's, caregivers, healthy volunteers and physicians with current studies.   Offer a free online tool, Alzheimer's Navigator, helps those facing the disease to determine their needs and develop an action plan, and our online [employer] is a Glass blower/designer of programs and service, housing and care services, and Clinical cytogeneticist.   House the Alzheimer's Association DIRECTV, the nation's DIRECTV and resource center devoted to increasing knowledge about Alzheimer's disease and related dementias.  Provide safety services, Comfort Zone and MedicAlert +  Alzheimer's Association Safe Return, provide location management for people with Alzheimer's who wander.   Our annual Walk to End Alzheimer's is the world's largest event to raise awareness and funds for Alzheimer's care, support and research.

## 2014-08-21 NOTE — Assessment & Plan Note (Signed)
Patient's BP elevated today to the 150s/70s. States she's no longer taking HCTZ (I do not see where she was taken off this medication) - PCP may consider re-starting medication/discussing this with the patient in the future. - continue to follow BP

## 2014-08-21 NOTE — Assessment & Plan Note (Addendum)
Wanda Conner has a [place] score of 9 which is very low in addition to functional deficits (such as driving/getting lost) meeting criteria for dementia without behavioral changes/hallucinations. Most likely Alzheimer's vs vascular dementia,  however will rule out reversible causes. The patient's geriatric depression score is significantly elevated, however I doubt her mood is causing her memory/functional deficits.  - TSH, B12, RPR, Head CT without contrast ordered today  - Discussed the diagnosis, community resources, and possible pharmacologic options (daughter-in-law and patient uninterested in medications currently). - Strongly recommended that the patient not drive; the patient and daughter-in-law were in agreement. Discussed resources at the [facility] for evaluation if the patient should want to drive in the future. - Advised the patient not to take Tylenol PM in the future. - Discussed the importance of POA and the patient discussing her wishes with her family.

## 2014-08-21 NOTE — Progress Notes (Signed)
Patient ID: Wanda Conner, female   DOB: 1934-01-22, 81 y.o.   MRN: [id number]  I have seen and examined this patient with Dr Lorenso Courier. I have discussed with the assessment and plan with her.  I agree with their findings and plans as documented in their Geriatric Medicine consultation note.     Driving Assessment  How did patient get to office today? Dgt-in-Law drove  Has patient had any problems driving?   Forgetting how to shift and getting lost while driving home  Have others expressed concerns about patient's driving?   Dgt-in-Law and her son.   How much does the patient drive?   Does not drive now. Driving around 20 miles weekly  Does patient have trouble driving during the day?   no;  At night?  sometimes;  In bad weather?   no;  On busy highways?  no  Does patient have trouble with the steering wheel?  no;  Reading or understanding road signs?  no;  Have difficulty at cross-roads?  no  Does patient think they are a safe driver?  yes  Does patient ever get lost while driving?  yes  Has patient received any traffic violations or warnings in the past 2 years?  not  Has patient had any car crashes or near crashes in the past 2 years?  no  If the patient's car broke down, how would they get around?  Take bus.        Who could give rides?  Dgt in law       Are buses or taxis available?  No in past. Does not use taxis       Have you ever used community or volunteer driver services?  No   Driving Relevant PMH  Heart attack Lung problems Stroke / TIA Traumatic Brain Injury Seizures Eye problems Memory loss Alcohol abuse Substance abuse Sleep Apnea Peripheral Neuropathy / loss of feeling in limbs Limb weakness / stiffness Diabetes / hypoglycemia Foot abnormalities Osteoarthritis  New medications or recent change in doses    ROS Lightheadedness / near-syncope: no  Syncope Vertigo Has told dgt in law that she feels dizzy Hypoglycemia Chest pain No  Sleep Attacks  No           Physical  Visual Acuity:    Hearing Screening   125Hz  250Hz  500Hz  1000Hz  2000Hz  4000Hz  8000Hz   Right ear:   40 40 40 40   Left ear:   40 40 40 40     Visual Acuity Screening   Right eye Left eye Both eyes  Without correction:     With correction: 20/30 20/30 20/25    Visual Fields:  full  Get up and Go test:  14 seconds  Range of Motions Neck:  Full range  Shoulder / elbow Simulated Turn:  Normal  Hand Grasp:  Normal/symmetric  Ankle Plantar Flesion:  5/5 bilaterally Ankle Dorsiflexion:  5/5 bilaterally   Motor Strength (x/5 scale) Right Hand:  5/5 Left Hand:  5/5  Right Elbow:  5/5 Left Elbow:  5/5 Right Shoulder:  5/5 Left Shoulder:  5/5 Right Hip :5/5  Left Hip: 5/5 Right Knee:  5/5 Left Knee:  5/5 Right Ankle:  5/5 Left Ankle:  5/5  Proprioception  Right Great Toe:  normal Left Great Toe:  Normal   Cognitive Testing MOCA score of 9, please view progress note from clinic today for specifics   I Agree than Wanda Conner has Dementia without Behavioral Disturbances. These changes are  progressively worsening  for her mother-in-law per the Dgt-in-Law, Wanda Conner over last year.   Head CT and blood work for reversible causes ordered.  The FAST stage of Dementia is in Stage 4 to 5 with impaired abilities to perform complex tasks, such as driving and fiances.  I agree that Wanda Conner should not drive given her impaired ability to navigate and confusion in manipulating the transmission.   If Wanda Conner insists on driving, Wanda Conner should be reported to [facility] [employer] [phone].  Where an evaluation of patient's ability to safely operate MV can be assessed and an administrative judgment on whether patient should keep their driving privileges will be made.  This office visit was  60 minutes in duration with over 20 minutes spent in couseling patient and family about dementia including importance of preparing financial and  health POA documents now rather than later.  Also discussed how to use reminiscence therapy to have a non-frustating interaction with patients with Dementia

## 2014-08-22 LAB — RPR

## 2014-08-26 ENCOUNTER — Encounter: Payer: Self-pay | Admitting: Family Medicine

## 2014-09-01 ENCOUNTER — Telehealth: Payer: Self-pay | Admitting: Family Medicine

## 2014-09-01 ENCOUNTER — Encounter: Payer: Medicare Other | Admitting: Family Medicine

## 2014-09-01 NOTE — Telephone Encounter (Signed)
Family Medicine Emergency Line Telephone Note  Returned call to [phone] to discuss Mrs. Stotz with her Son, Aerilyn, Slee. He is concerned that she has grown increasingly agitated over the past several days. She was recently diagnosed with dementia and her husband passed away last week. She is accusing her son of taking her money and also trying to get him to take a check from her. He would like some medication to calm her down. He denies any violent behaviors, just nonsensical speaking. She is not aphasic or demonstrating new focal deficits. Onset was gradual over several days.   After discussion of general ways to aid orientation I offered the options of going to the emergency room (though she is neither a threat to herself nor others) and making an appointment tomorrow. He opts for an appointment with the understanding that if she demonstrates violent behavior dangerous to herself or others or if she develops focal weakness or slurred speech she must be taken to the ED right away.   Appointment was made with only provider available tomorrow morning, Dr. Caryl Bis, at 11:30am. They are to arrive at 11:15am.   Macarena Langseth B. Bonner Puna, MD, PGY-2 09/01/2014 8:59 PM

## 2014-09-02 ENCOUNTER — Telehealth: Payer: Self-pay | Admitting: Family Medicine

## 2014-09-02 ENCOUNTER — Ambulatory Visit (INDEPENDENT_AMBULATORY_CARE_PROVIDER_SITE_OTHER): Payer: Medicare Other | Admitting: Family Medicine

## 2014-09-02 ENCOUNTER — Encounter: Payer: Self-pay | Admitting: Family Medicine

## 2014-09-02 VITALS — BP 142/82 | HR 76 | Temp 97.9°F | Wt 126.0 lb

## 2014-09-02 DIAGNOSIS — R748 Abnormal levels of other serum enzymes: Secondary | ICD-10-CM | POA: Diagnosis not present

## 2014-09-02 DIAGNOSIS — R7989 Other specified abnormal findings of blood chemistry: Secondary | ICD-10-CM

## 2014-09-02 DIAGNOSIS — R451 Restlessness and agitation: Secondary | ICD-10-CM | POA: Diagnosis not present

## 2014-09-02 HISTORY — DX: Restlessness and agitation: R45.1

## 2014-09-02 HISTORY — DX: Abnormal levels of other serum enzymes: R74.8

## 2014-09-02 HISTORY — DX: Other specified abnormal findings of blood chemistry: R79.89

## 2014-09-02 LAB — CBC WITH DIFFERENTIAL/PLATELET
BASOS PCT: 0 % (ref 0–1)
Basophils Absolute: 0 10*3/uL (ref 0.0–0.1)
EOS ABS: 0 10*3/uL (ref 0.0–0.7)
Eosinophils Relative: 0 % (ref 0–5)
HCT: 38.8 % (ref 36.0–46.0)
Hemoglobin: 12.8 g/dL (ref 12.0–15.0)
Lymphocytes Relative: 23 % (ref 12–46)
Lymphs Abs: 1.9 10*3/uL (ref 0.7–4.0)
MCH: 32.3 pg (ref 26.0–34.0)
MCHC: 33 g/dL (ref 30.0–36.0)
MCV: 98 fL (ref 78.0–100.0)
MONO ABS: 0.5 10*3/uL (ref 0.1–1.0)
MPV: 10.3 fL (ref 8.6–12.4)
Monocytes Relative: 6 % (ref 3–12)
Neutro Abs: 5.8 10*3/uL (ref 1.7–7.7)
Neutrophils Relative %: 71 % (ref 43–77)
PLATELETS: 305 10*3/uL (ref 150–400)
RBC: 3.96 MIL/uL (ref 3.87–5.11)
RDW: 13.1 % (ref 11.5–15.5)
WBC: 8.2 10*3/uL (ref 4.0–10.5)

## 2014-09-02 MED ORDER — LAMOTRIGINE 25 MG PO TABS: 25.0000 mg | ORAL_TABLET | Freq: Every day | ORAL | Status: DC

## 2014-09-02 NOTE — Assessment & Plan Note (Addendum)
Patient recently diagnosed with dementia and has recently undergone the loss of her husband last week. Since that time has become intermittently agitated and developed delusions. She denies SI and HI and does not appear to be a danger to anyone else or herself at this time. I discussed the case with Dr McDiarmid, our clinics geriatrician, who saw the patient for geriatric visit last month. He recommended starting the patient on lamictal 25 mg daily and to have the patient follow-up with him in 2 weeks to consider titration of this medication up. I discussed the risk of stevens johnson syndrome with the patient and her family and advised that if she were to develop any sort of rash after starting the medication they would need to stop the medication immediately and call us immediately. They voiced understanding of this. Given return precautions.

## 2014-09-02 NOTE — Progress Notes (Signed)
Patient ID: RASHADA KLONTZ, female   DOB: 25-Oct-1933, 81 y.o.   MRN: [id number]  Tommi Rumps, MD Phone: [phone]  Saadiya Wilfong Welford is a 81 y.o. female who presents today for same day appointment.  Patient presents with her family with concern of becoming increasingly agitated since the death of her husband last week. She notes initially she was crying around the clock, though this has improved. Notes feeling down as she lost her husband who she's been with for many years. No depression, SI, or HI. She does report increasing agitation and this is supported by her son and daughter in law. Gets angry regarding not being able to drive. Has some delusions that her family is trying to steal her money and car. Has wandered out of the house, though has not gotten lost. Son notes that the relationships with the rest of the family is hostile.  Patient noted to have elevated B12 at last office visit. She reports not taking B12 supplements and not receiving injections of B12.   PMH: nonsmoker.   ROS: Per HPI   Physical Exam Filed Vitals:   09/02/14 1216  BP: 142/82  Pulse:   Temp:     Gen: Well NAD, alert, oriented to person place and time Lungs: CTABL Nl WOB Heart: RRR  Exts: Non edematous BL  LE, warm and well perfused.    Assessment/Plan: Please see individual problem list.  Tommi Rumps, MD [place] PGY-3

## 2014-09-02 NOTE — Patient Instructions (Addendum)
Nice to see you. Please start the lamictal. This will help with agitation.  If you develop any kind of rash while on this please stop the medication and call us immediately.  If you feel as though you are going to hurt yourself or anyone else please seek medical attention.  Please schedule a follow-up appointment with Dr McDiarmid.

## 2014-09-02 NOTE — Telephone Encounter (Signed)
Patient's daughter in law asked for an appointment for the [facility] in two weeks. It is necessary to get authorization for override appointment since physician is already booked. Please, follow up with Mrs. Magnus Ivan.

## 2014-09-02 NOTE — Assessment & Plan Note (Signed)
Denies supplements. Will check CBC with diff to evaluate for hematologic causes.

## 2014-09-03 ENCOUNTER — Encounter: Payer: Self-pay | Admitting: Family Medicine

## 2014-09-03 NOTE — Telephone Encounter (Signed)
Patient is aware of this. Wanda Conner,CMA  

## 2014-09-03 NOTE — Telephone Encounter (Signed)
May schedule for 8:30 am on 09/18/14.  You may have to bring her in as an SDA, then assign Wanda Conner to [place] to see.

## 2014-09-12 ENCOUNTER — Ambulatory Visit (HOSPITAL_COMMUNITY)
Admission: RE | Admit: 2014-09-12 | Discharge: 2014-09-12 | Disposition: A | Discharge status: Home / Self Care | Payer: Medicare Other | Source: Ambulatory Visit | Attending: Family Medicine | Admitting: Family Medicine

## 2014-09-12 DIAGNOSIS — F0390 Unspecified dementia without behavioral disturbance: Secondary | ICD-10-CM | POA: Insufficient documentation

## 2014-09-12 DIAGNOSIS — R4182 Altered mental status, unspecified: Secondary | ICD-10-CM | POA: Diagnosis present

## 2014-09-18 ENCOUNTER — Ambulatory Visit (INDEPENDENT_AMBULATORY_CARE_PROVIDER_SITE_OTHER): Payer: Medicare Other | Admitting: Family Medicine

## 2014-09-18 ENCOUNTER — Telehealth: Payer: Self-pay | Admitting: *Deleted

## 2014-09-18 ENCOUNTER — Encounter: Payer: Self-pay | Admitting: Clinical

## 2014-09-18 ENCOUNTER — Encounter: Payer: Self-pay | Admitting: Family Medicine

## 2014-09-18 VITALS — BP 153/56 | HR 75 | Temp 98.0°F | Ht 63.5 in | Wt 122.0 lb

## 2014-09-18 DIAGNOSIS — R451 Restlessness and agitation: Secondary | ICD-10-CM

## 2014-09-18 DIAGNOSIS — F0391 Unspecified dementia with behavioral disturbance: Secondary | ICD-10-CM | POA: Diagnosis not present

## 2014-09-18 DIAGNOSIS — R634 Abnormal weight loss: Secondary | ICD-10-CM | POA: Diagnosis not present

## 2014-09-18 DIAGNOSIS — F03918 Unspecified dementia, unspecified severity, with other behavioral disturbance: Secondary | ICD-10-CM

## 2014-09-18 HISTORY — DX: Abnormal weight loss: R63.4

## 2014-09-18 MED ORDER — LAMOTRIGINE 25 MG PO TABS: 25.0000 mg | ORAL_TABLET | Freq: Every day | ORAL | Status: DC

## 2014-09-18 MED ORDER — LAMOTRIGINE 25 MG PO TABS: 50.0000 mg | ORAL_TABLET | Freq: Every day | ORAL | Status: DC

## 2014-09-18 NOTE — Patient Instructions (Signed)
Increase your Lamictal to 50 mg tablet, one table at bedtime.  Do not take any medicine that has PM in the name.  It contains benadryl.

## 2014-09-18 NOTE — Progress Notes (Signed)
Patient ID: Wanda Conner, female   DOB: 1934-03-11, 81 y.o.   MRN: [id number] Geriatrics Visit Note  Provider: Howard Pouch, MD Location of Care: [facility],   Patient is accompanied by Dgt-in-Law.  Primary caregiver is son and dgt-in-law. Patient's In a rental house next door to her son's home. Patient information was obtained from patient and dgt-in-law who was interviewed separately from patient. History/Exam limitations: dementia.   The family and the patient identify problems with wandering and anger towards caregivers, agitation and belief that her family is stealing from her. .  Family and patient are concerned about  medication errors, wandering, eating and driving, however, they are not concerned about cooking.  Chief Complaint:  Chief Complaint  Patient presents with  . Dementia    HISTORY OF PRESENT ILLNESS: Wanda Conner is a 81 y.o. female retiree, recently widowed, now living next door to her son and dgt-in-law.  Dementia / Cognitive impairment - BEHAVE-AD questionnaire used to collect data from dgt-in-law about abnormal or unusual behaviors by patient - Onset: diagnosed in [facility] 08/21/14  - Change in ADLs / iADLs: Performing ADLs but needs assistance or dependent in shopping, household care, administration of medications, phone use, and transportation - There has been changes in personality and behaviors. -  Delusions: Patient expresses belief that family members are stealing money and her personal property like her phone book packing to leave to go to her dgt's home who has not invited her to stay with her, entering her home when she is asleep, suspicion that her caregiver son is plotting to have her institutionalized so he can take all her money, frequently calls others then thinks they called her. Hiding shoes then accusing Cable man of stealing it.     - Hallucinations: none - Aggression / inappropriate behaviors: Angrily  accused son of selling her car and keeping the money to degree that son know believes his mother hates him.   She is unable to find the cash after she hides it in her house. Angry with son and dgt-in-law because she believes they are keeping her dgt and granddgt away from her. Has called the police twice on her family accusing them of stealing from her.  There aggression has only been verbal, there has not been any physical threats or vilence.  There quite a bit of variation from day to day in her mood and affect, from very pleasant to sustained anger towards her caretakers.  - day / night disturbance: Repetitive awaking at night - wandering / pacing: Patient caring cash around with her up to $300 and wandering in neighborhood.  She has not gotten lost.  Family has had to go out to get her.  - tearful / sad appearing: occasional. Has told dgt-in-law that she would not harm herself. - (+) abandonment fears / perseverating  - Behavoral and Psychiatric Medications: Lamictal 25 mg daily, Dgt-in-law is thinks patient is not always taking Lamictal as she sometimes finds a lamictal tablet that came from patient's pill box scheduler.  Adverse Effects: Denies Rash, Somnolence, Dry mouth,  - MoCa (08/21/14): 9 out of 30 - FAST Stage (4- needs help with iADLs)  Outpatient Encounter Prescriptions as of 09/18/2014  Medication Sig  . acetaminophen (TYLENOL) 500 MG tablet Take 500 mg by mouth every 6 (six) hours as needed.  . bisacodyl (DULCOLAX) 5 MG EC tablet Take 5 mg by mouth daily as needed for moderate constipation.  . diphenhydramine-acetaminophen (TYLENOL  PM) 25-500 MG TABS Take 1 tablet by mouth at bedtime as needed.  . Ferrous Sulfate (IRON) 28 MG TABS Take by mouth.  . Ginkgo Biloba 40 MG TABS Take by mouth.  . loratadine (CLARITIN) 10 MG tablet Take 10 mg by mouth daily.  . Multiple Minerals (CALCIUM-MAGNESIUM-ZINC) TABS Take by mouth.  . Multiple Vitamin (MULTIVITAMIN WITH MINERALS) TABS tablet Take  1 tablet by mouth daily.  . Omega-3 Fatty Acids (FISH OIL PO) Take 1 capsule by mouth every morning.  . Potassium 95 MG TABS Take by mouth.  . [DISCONTINUED] cetirizine (ZYRTEC) 10 MG tablet Take 10 mg by mouth as needed for allergies.  . [DISCONTINUED] lamoTRIgine (LAMICTAL) 25 MG tablet Take 1 tablet (25 mg total) by mouth daily.  . [DISCONTINUED] Multiple Vitamins-Minerals (CENTRUM SILVER ULTRA WOMENS) TABS Take 1 tablet by mouth daily.   . [DISCONTINUED] vitamin C (ASCORBIC ACID) 250 MG tablet Take 250 mg by mouth daily.  . hydrochlorothiazide (HYDRODIURIL) 12.5 MG tablet Take 1 tablet (12.5 mg total) by mouth daily. (Patient not taking: Reported on 09/18/2014)  . lamoTRIgine (LAMICTAL) 25 MG tablet Take 1 tablet (25 mg total) by mouth daily.     History Past Medical History  Diagnosis Date  . Depression   . GERD (gastroesophageal reflux disease)   . Anemia     History of this  . Seasonal allergies   . Arthritis   . Uterine prolapse 10/25/2013   Past Surgical History  Procedure Laterality Date  . Joint replacement      Right kee replacement 2009  . Breast surgery      cyst removal  . Total knee arthroplasty  10/31/2011    Procedure: TOTAL KNEE ARTHROPLASTY;  Surgeon: Kerin Salen, MD;  Location: [facility];  Service: Orthopedics;  Laterality: Left;   Family History  Problem Relation Age of Onset  . Diabetes Father   . Diabetes Brother     reports that she has never smoked. She has never used smokeless tobacco. She reports that she does not drink alcohol or use illicit drugs.  Activities of Daily Living - Current Assessment walk without assistance ability to climb steps?  yes transfers alone eating, bathing, toileting, personal cares, ambulating, grooming, hygiene, dressing upper body and dressing lower body  Medication administration: family sets up medications.  Overall functional status:  Partial assistance  Comments:  While patient wants to drive and has a Museum/gallery conservator, her son has taken the keys away.  Falls in the past six months:  No  Diet:  general Supplemental shakes: no    Filed Vitals:   09/18/14 0924  BP: 153/56  Pulse: 75  Temp: 98 F (36.7 C)    Wt Readings from Last 3 Encounters:  09/18/14 122 lb (55.339 kg)  09/02/14 126 lb (57.153 kg)  08/21/14 123 lb 8 oz (56.019 kg)     Review of Systems - See HPI General: Denies fevers   Cardiovascular: Denies  dyspnea on exertion, orthopnea, peripheral edema.  Respiratory: Denies cough.  Gastrointestinal: Denies abd pain. Denies loss of appetite  Genitourinary: Denies incontinence.  Musculoskeletal: Denies joint pain Skin: Denies skin rash or ulcers. Neurologic: Denies transient paralysis, weakness, paresthesias, headache.  Psychiatric: Denies depression. Denies seeing things that are not there or hearing things others cannot hear.  Marland Kitchen   PHYSICAL EXAM:  General: No acute distress,thin, pleasant, cooperative, groomed,   CV:  RRR, no murmur, no ankle swelling RESP: No resp distress or accessory muscle use.  Clear to ausc bilat. No wheezing, no rales, no rhonchi.   Skin:  No significant skin lesions or rash  Psych:  Mood and affect angry when talking about her suspicions of her family Montreal Cognitive Assessment  08/21/2014  Visuospatial/ Executive (0/5) 1  Naming (0/3) 1  Attention: Read list of digits (0/2) 2  Attention: Read list of letters (0/1) 0  Attention: Serial 7 subtraction starting at 100 (0/3) 0  Language: Repeat phrase (0/2) 1  Language : Fluency (0/1) 0  Abstraction (0/2) 2  Delayed Recall (0/5) 0  Orientation (0/6) 2  Total 9  Adjusted Score (based on education) 9   BEHAVE-AD Scale Score = 25 points out of 108 points   Assessment and Plan:   Problem List Items Addressed This Visit      Nervous and Auditory   Dementia with behavioral disturbance - Primary   Relevant Medications   lamoTRIgine (LAMICTAL) 25 MG tablet   Other Relevant Orders    Ambulatory referral to [facility]     Other   Weight loss, unintentional   Agitation      Code Status:   FULL First contact:  Son, Trany, Chernick. [phone])  Follow Up Appointment:  3 weeks with Louis Ivery

## 2014-09-18 NOTE — Assessment & Plan Note (Addendum)
No improvement in Behavioral and Psychological Symptoms of Dementia  Behavioral and Psychological Symptoms of Dementia: Inappropriate Behaviors, Paranoid beliefs, and Wandering  Predominantly of Inappropriate behavior (agitation and anger towards caretakers) and paranoid beliefs (family stealing from her, people sneaking into her home, fear of abandonment).  She has shown motor behavior of wandering outside her house requiring family members to go get her and bring her home.   FAST Stage: 4  Pharmacologic treatments of patient: Lamictal 25 mg daily for 3 weeks.  Response: none per dgt-in-law. Adverse effects from pharmacologic treatments: none Recommend increase in Lamictal to 50 mg daily at bedtime. RTC in 2 weeks to assess tolerance and response. If mood stabilization does not improve her agitated behavior adequately, a short-term course of risperidone starting at 0.25 mg daily and titrate to tolerance and effect on her paranoid delusion and aggitative behavior.  Supportive Interventions for caregiver (support group or individual counseling): Dgt-in-law meet with Hunt Oris to discuss role of APS Medication free trial: No:   Long-term care planning: Support that Mrs Whitmire' family should pursue incompetency and guardianship of her.  Her autonomy is placing her safety at risk. Ms Leclaire would be a good candidate for a [place] with an Memory Unit to preserve as much of her autonomy as possible within a safe enviroment.

## 2014-09-18 NOTE — Telephone Encounter (Signed)
Will forward to MD to clarify medication. Breck Hollinger,CMA

## 2014-09-18 NOTE — Telephone Encounter (Signed)
Verbal given to pharmacy for 50mg  1 tablet daily.  Jazmin Hartsell,CMA

## 2014-09-18 NOTE — Progress Notes (Signed)
CSW received a referral to provide daughter in law with resources related to pts dementia. Daughter in law reports pt is living alone next door and currently is still able to cook, feed herself and for the most part complete most of her ADL's. Daughter in law however states she and the family are concerned about pt wandering off and not taking her medications correctly. Apparently pt prefers living alone and the family has had a difficult time with pts behavior. CSW provided daughter in law with resources for Department of Social Services: Applying for [employer], APS and Case Management Services. CSW also provided daughter in law with the contact number to the [employer] and suggested a [facility] nurse be sent out to assist with med management. Daughter in law  agreeable and appreciative. No additional concerns at this time.  Hunt Oris, MSW, [place]

## 2014-09-18 NOTE — Telephone Encounter (Signed)
Received call from pharmacy needing clarification on Lamictal.  Pt states she is to take med BID but new Rx written as once a day. Liani Caris, Salome Spotted

## 2014-09-18 NOTE — Telephone Encounter (Signed)
Wanda Conner should take Lamictal 50 mg only once a day at bedtime.

## 2014-09-19 ENCOUNTER — Encounter: Payer: Medicare Other | Admitting: Family Medicine

## 2014-09-23 ENCOUNTER — Telehealth: Payer: Self-pay | Admitting: Family Medicine

## 2014-09-23 DIAGNOSIS — F0391 Unspecified dementia with behavioral disturbance: Secondary | ICD-10-CM

## 2014-09-23 NOTE — Telephone Encounter (Signed)
Dr. Raoul Pitch, can you place a referral for a [facility] social worker? I will fax it to [facility] when its placed.  Hunt Oris, MSW, [place]

## 2014-09-23 NOTE — Telephone Encounter (Signed)
Will forward to MD and CSW to see what options there are for this. Shaquan Missey,CMA

## 2014-09-23 NOTE — Telephone Encounter (Signed)
Wanda Conner Nurse: Would like to have social worker come out. She has demential and has started making some accusations about her family Please advise

## 2014-09-24 NOTE — Telephone Encounter (Signed)
Referral for social worker placed and sent to [facility].  Hunt Oris, MSW, [place]

## 2014-09-25 ENCOUNTER — Telehealth: Payer: Self-pay | Admitting: Family Medicine

## 2014-09-25 ENCOUNTER — Encounter: Payer: Self-pay | Admitting: Family Medicine

## 2014-09-25 ENCOUNTER — Telehealth: Payer: Self-pay | Admitting: Clinical

## 2014-09-25 NOTE — Progress Notes (Unsigned)
Patient left a message on the medical records line requesting Dr. McDiarmid to call her.  Her number is [phone]

## 2014-09-25 NOTE — Telephone Encounter (Signed)
Pt contacted CSW stating that she is having problems with her family. Pt stated that she has a supportive pastor that she plans on contact. CSW encouraged the pt to contact her pastor-friend who appears to be more familiar with the pt/family dynamics. Pt appreciative and agreeable to do so.  Hunt Oris, MSW, [place]

## 2014-10-13 ENCOUNTER — Telehealth: Payer: Self-pay | Admitting: Family Medicine

## 2014-10-13 NOTE — Telephone Encounter (Signed)
[facility] Emergency Line Call  Patients son calls asking to schedule an appointment for tomorrow for the patient. He notes he dementia has worsened and the medication (lamictal) has not helped. She has been wandering out of the house and in the streets. Asked a stranger for money. Has been calling the police for no reason. Son states she has not expressed any dangerous thoughts or actions towards herself or others. He is going to be able to watch her tonight and her neighbors watch her during the day. He does not she has been aggressive when people try to help her, though has not hurt anyone. I advised that if she has been aggressive she should be evaluated tonight, though he noted that they did not want to do this as it would make her upset. I checked for appointments for tomorrow, though there do not appear to be any available appointments, so appointment was made for Wednesday at 8:30 am. Advised that if she becomes harmful to her self or others or becomes aggressive they should take her to the ED for evaluation. He voiced understanding.   Tommi Rumps, MD

## 2014-10-14 ENCOUNTER — Telehealth: Payer: Self-pay | Admitting: *Deleted

## 2014-10-14 ENCOUNTER — Encounter: Payer: Self-pay | Admitting: Family Medicine

## 2014-10-14 ENCOUNTER — Telehealth: Payer: Self-pay | Admitting: Family Medicine

## 2014-10-14 NOTE — Telephone Encounter (Signed)
Need a letter for social security department stating what is going on with Mrs. Wanda Conner (Husband recently died).  Son is asking to speak with Dr. McDiarmid directly about some other issues.  Best number to reach him at is [phone]. [place], Salome Spotted

## 2014-10-14 NOTE — Telephone Encounter (Signed)
Will forward to MD for review. Riana Tessmer, CMA.

## 2014-10-14 NOTE — Telephone Encounter (Signed)
CSW contacted pts son to provide information on APS and obtaining guardianship. Son very concerned about pt, who presents with dementia, and is wandering off asking strangers for money, has lost money, is not locking the doors in her home, no longer taking her medications or coming to her appointments. CSW validated concerns and provided information on steps that son can take. CSW informed son that the guardianship process can be a lengthy process. Son was encouraged to contact CSW for additional concerns or resources.  Hunt Oris, MSW, [place]

## 2014-10-14 NOTE — Telephone Encounter (Signed)
-----   Message from Blane Ohara McDiarmid, MD sent at 10/14/2014 11:53 AM EDT ----- Please contact [name], Wanda Conner, Wanda Conner. [phone]) to let him know the letter for DSS is ready for pick up at the [facility].

## 2014-10-14 NOTE — Telephone Encounter (Signed)
Advised pt's son, Jaquala Fuller as directed below and verbalized understanding. Jaan Fischel, CMA.

## 2014-10-14 NOTE — Telephone Encounter (Signed)
Son called back and would like to speak to Dr. McDiarmid about his mother Wanda Conner. jw

## 2014-10-14 NOTE — Telephone Encounter (Signed)
Patient refusing to come to doctor's visits, not taking her medications, resistive to necessary care per her son, Kela, Bacc[place].   Mr Sobol requesting a letter to present to social security with her physician's diagnosis and opinion on her ability to manage her finances independently.  I advised Mr Fudala that he will need to seek a judgment that his mother lacks capacity to independently care for herself and her assets in a manner consistent with her best interests and her safety.  The judgment sought should be of a kind that will allow Mr Game to decide for his mother the setting in which she should live as well as her forfeiture of right to manage her own assets.   Recommended that Mr Kirt speak with Hunt Oris, MSW, to learn about the process of seeking a legal capacity decision from courts, either on their own or with the assistance of Adult Protective Services.

## 2014-10-15 ENCOUNTER — Ambulatory Visit: Payer: Medicare Other | Admitting: Family Medicine

## 2014-10-15 ENCOUNTER — Encounter: Payer: Self-pay | Admitting: Family Medicine

## 2014-10-15 NOTE — Telephone Encounter (Signed)
CSW agrees that it is imperative that pts son contact APS and pursue the guardianship process.  Hunt Oris, MSW, [place]

## 2014-10-15 NOTE — Telephone Encounter (Signed)
Mr Wanda Conner reports Pt moved all her money to new accounts. Her benefits and income will not be able able to be deposited now Mrs Wanda Conner is planning on moving to [place] to live alone.  Her son reports this change was at the suggestion of one of Mrs Wanda Conner. He believes the daughter will take all the money in her new accounts  I recommended that Mr Hollings contact APS immediately because of risk of Financial Exploitation of his mother and unsafe behaviors by his mother.

## 2014-10-16 ENCOUNTER — Ambulatory Visit: Payer: Medicare Other | Admitting: Family Medicine

## 2014-10-30 ENCOUNTER — Ambulatory Visit: Payer: Medicare Other | Admitting: Family Medicine

## 2014-11-20 ENCOUNTER — Ambulatory Visit (INDEPENDENT_AMBULATORY_CARE_PROVIDER_SITE_OTHER): Payer: Medicare Other | Admitting: Family Medicine

## 2014-11-20 ENCOUNTER — Encounter: Payer: Self-pay | Admitting: Family Medicine

## 2014-11-20 VITALS — BP 129/86 | HR 68 | Temp 97.9°F | Ht 64.0 in | Wt 119.0 lb

## 2014-11-20 DIAGNOSIS — F0391 Unspecified dementia with behavioral disturbance: Secondary | ICD-10-CM

## 2014-11-20 DIAGNOSIS — S91309A Unspecified open wound, unspecified foot, initial encounter: Secondary | ICD-10-CM | POA: Insufficient documentation

## 2014-11-20 DIAGNOSIS — F03918 Unspecified dementia, unspecified severity, with other behavioral disturbance: Secondary | ICD-10-CM

## 2014-11-20 DIAGNOSIS — S91301A Unspecified open wound, right foot, initial encounter: Secondary | ICD-10-CM

## 2014-11-20 MED ORDER — RIVASTIGMINE 4.6 MG/24HR TD PT24: 4.6000 mg | MEDICATED_PATCH | Freq: Every day | TRANSDERMAL | Status: DC

## 2014-11-20 MED ORDER — DONEPEZIL HCL 5 MG PO TABS: 5.0000 mg | ORAL_TABLET | Freq: Every day | ORAL | Status: DC

## 2014-11-20 NOTE — Progress Notes (Signed)
Patient ID: Wanda Conner, female   DOB: 1933-12-04, 81 y.o.   MRN: [id number] Wanda Conner:   Patient is accompanied by: Dgt-in-Law Primary caregiver: son and Dgt-in-law Patient's lives alone in house two doors down from her son and Dgt-in-Law Patient information was obtained from patient, relative(s), past medical records and son to whom I spoke by phone. I spoke with Dgt-in-Law  History/Exam limitations: Dementia. Primary Care Provider: Howard Pouch, DO  Reason for referral:  Chief Complaint  Patient presents with  . Dementia    alzheimers, medication concerns causing anger  . Foot Pain   History Chief Complaint  Patient presents with  . Dementia - Follow up of dementia with behavioral and psychological symptoms of dementia.  - Pt has not taken the prescribed lamictal in a regular fashion.    - Pt's overall anger and agitation have improved since last office visit at [facility] in that it is not present continually as before, though angry outbursts continue if family attempts to give her directions for her safety or well-being.  - Son and Dgt-in-law have temporary guardianship of patients. They have a court hearing date next month for assignment of permanent guardianship.  Her son is managing her finances.  - Pt has gotten lost taking the bus recently.  Strangers called the family to come get her.  Wanda Conner denies this happened. She has wandered down to the local grocery store and attempted to get into a strangers car.   - During this office visit, Wanda Conner accused her Dgt-in-law of coming into her home at night to take her things.   - Family have investigated ALF placement, but felt they did not have enough money to place Wanda Conner into a memory unit. - Family has taken Wanda Conner to [facility] which patient has apparently enjoyed. The family planned to take her 4 times a seek to the Center, but patient has refused to go more than once a week.  It is  not clear why she refuses.  - Family sets up her pill boxes each week but patient picks and chooses amongst the medications in the boxes.  - Church  friend, Ms Hardin Conner, assists Wanda Conner with shopping.   Wanda Conner is not driving. - Family planning on applying for [employer] transportation.  - Pt is eating well.  Dgt-in-law brings over meals to patient.  - No rash, no falls, no dizziness, no GI upset.         . Foot Pain - Location: inner side of sole,  right front foot.  Onset: last night Quality: aching Severity: mild to moderate Function: limp Duration: one day Pattern: steady Course: improving Radiation: no Relief: getting off feet Precipitant: none recalled.  Denies paring skin of feet.  Associated Symptoms: No numbness.  No swelling of feet.  Trauma (Acute or Chronic): Denies Prior Diagnostic Testing or Treatments: none Relevant PMH/PSH: none       Outpatient Encounter Prescriptions as of 11/20/2014  Medication Sig  . acetaminophen (TYLENOL) 500 MG tablet Take 500 mg by mouth every 6 (six) hours as needed.  . bisacodyl (DULCOLAX) 5 MG EC tablet Take 5 mg by mouth daily as needed for moderate constipation.  . Ferrous Sulfate (IRON) 28 MG TABS Take by mouth.  . Ginkgo Biloba 40 MG TABS Take by mouth.  . loratadine (CLARITIN) 10 MG tablet Take 10 mg by mouth daily.  . Multiple Minerals (CALCIUM-MAGNESIUM-ZINC) TABS Take by mouth.  . Multiple  Vitamin (MULTIVITAMIN WITH MINERALS) TABS tablet Take 1 tablet by mouth daily.  . Omega-3 Fatty Acids (FISH OIL PO) Take 1 capsule by mouth every morning.  . Potassium 95 MG TABS Take by mouth.  . rivastigmine (EXELON) 4.6 mg/24hr Place 1 patch (4.6 mg total) onto the skin daily.  . [DISCONTINUED] diphenhydramine-acetaminophen (TYLENOL PM) 25-500 MG TABS Take 1 tablet by mouth at bedtime as needed.  . [DISCONTINUED] donepezil (ARICEPT) 5 MG tablet Take 1 tablet (5 mg total) by mouth at bedtime.  . [DISCONTINUED] hydrochlorothiazide  (HYDRODIURIL) 12.5 MG tablet Take 1 tablet (12.5 mg total) by mouth daily. (Patient not taking: Reported on 09/18/2014)  . [DISCONTINUED] lamoTRIgine (LAMICTAL) 25 MG tablet Take 2 tablets (50 mg total) by mouth daily.   No facility-administered encounter medications on file as of 11/20/2014.   History Patient Active Problem List   Diagnosis Date Noted  . Weight loss, unintentional 09/18/2014  . Elevated vitamin B12 level 09/02/2014  . Dementia with behavioral disturbance 08/21/2014  . Uterine prolapse 10/25/2013  . Bony growth 12/12/2012  . Hypertension, essential, benign 11/21/2011  . Osteoarthritis of left knee 10/31/2011  . Preventative health care 12/29/2010  . Urge incontinence 12/29/2010   Past Medical History  Diagnosis Date  . Depression   . GERD (gastroesophageal reflux disease)   . Anemia     History of this  . Seasonal allergies   . Arthritis   . Uterine prolapse 10/25/2013  . Urge incontinence 12/29/2010  . Palpitations 12/12/2012  . Osteoarthritis of left knee 10/31/2011    Status post knee replacement on 10/31/2011.   Marland Kitchen Hypertension, essential, benign 11/21/2011  . Dementia with behavioral disturbance 08/21/2014  . Agitation 09/02/2014  . Weight loss, unintentional 09/18/2014  . Personal history of peptic ulcer disease 11/12/2009    Qualifier: Diagnosis of  By: Wanda Amber MD, Wanda Conner    . Elevated vitamin B12 level 09/02/2014   Past Surgical History  Procedure Laterality Date  . Joint replacement      Right kee replacement 2009  . Breast surgery      cyst removal  . Total knee arthroplasty  10/31/2011    Procedure: TOTAL KNEE ARTHROPLASTY;  Surgeon: Wanda Salen, MD;  Location: [facility];  Service: Orthopedics;  Laterality: Left;   Family History  Problem Relation Age of Onset  . Diabetes Father   . Diabetes Brother    History   Social History  . Marital Status: Widowed    Spouse Name: N/A  . Number of Children: 2  . Years of Education: 37   Social History Main Topics   . Smoking status: Never Smoker   . Smokeless tobacco: Never Used  . Alcohol Use: No  . Drug Use: No  . Sexual Activity: No   Other Topics Concern  . None   Social History Narrative   Patient very religious, this informs much of her decision making.  Also very leery of Westernized medicine, refuses all but a few medications.  Declines all screening and preventative health at all.      Basic Activities of Daily Living  ADLs Independent Needs Assistance Dependent  Bathing x    Dressing x    Ambulation  X 1-point cane   Toileting x    Eating x     Instrumental Activities of Daily Living IADL Independent Needs Assistance Dependent  Cooking x    Housework x    Manage Medications   x  Manage the telephone  x  Shopping for food, clothes, Meds, etc  x   Use transportation   x  Manage Finances   x    Caregivers in home: none but son and dgt-in-law live just 2 doors down from her house.   Caregiver Burdens: Both son and dgt-in-law are stressed by difficulty of keeping Wanda Hoes safe while Wanda Francesconi gets angry with them whenever they try to keep her safe.    Falls in the past six months:   no  Health Maintenance reviewed: Immunization History  Administered Date(s) Administered  . Influenza Whole 03/29/2007, 02/15/2008  . Pneumococcal Polysaccharide-23 08/21/2000  . Td 08/21/2001  . Tdap 12/12/2012   Health Maintenance Topics with due status: Overdue     Topic Date Due   DEXA SCAN 09/09/2000   PNA vac Low Risk Adult 08/21/2001    Diet: Regular Nutritional supplements: none   Vital Signs Weight: 119 lb (53.978 kg) Body mass index is 20.42 kg/(m^2). CrCl cannot be calculated (Patient has no serum creatinine result on file.). Body surface area is 1.56 meters squared. Filed Vitals:   11/20/14 0950  BP: 129/86  Pulse: 68  Temp: 97.9 F (36.6 C)  TempSrc: Oral  Height: 5\' 4"  (1.626 m)  Weight: 119 lb (53.978 kg)   Wt Readings from Last 3 Encounters:  11/20/14  119 lb (53.978 kg)  09/18/14 122 lb (55.339 kg)  09/02/14 126 lb (57.153 kg)    Physical Examination:  VS reviewed GEN: Alert, Groomed, NAD, intermittent anger when asked about her home activities and memory problems.  HEENT: PERRL; EAC bilaterally not occluded, TM's translucent with normal LM, COR: RRR, No M/G/R, No JVD, Normal PMI size and location LUNGS: BCTA, No Acc mm use, speaking in full sentences EXT: No peripheral leg edema. Right and left feet with 1st toe hallusis valgus with bunions and linear permanent looking curved skin indention over lateral sole heads of 1st metatarsal heads about 2 to 3 mm deep.  Right indention has 1 cm x 1 mm linear laceration-like lesion at apex of skin indentation.  SKIN: No lesion nor rashes of face/xtremities Neuro: Socially conversant, speaks in generalities but unable to provide specifics about her life; Able to get up and down from exam table without assistance.  Moving all extremities.    Psych: Intermittent angry affect but is redirectable. Suspicious. Language concrete, restricted but goal directed.    Mini-Mental State Examination or Montreal Cognitive Assessment:  Total Score:No flowsheet data found.     Montreal Cognitive Assessment  08/21/2014  Visuospatial/ Executive (0/5) 1  Naming (0/3) 1  Attention: Read list of digits (0/2) 2  Attention: Read list of letters (0/1) 0  Attention: Serial 7 subtraction starting at 100 (0/3) 0  Language: Repeat phrase (0/2) 1  Language : Fluency (0/1) 0  Abstraction (0/2) 2  Delayed Recall (0/5) 0  Orientation (0/6) 2  Total 9  Adjusted Score (based on education) 9    Labs  Lab Results  Component Value Date   VITAMINB12 1813* 08/21/2014    No results found for: FOLATE  Lab Results  Component Value Date   TSH 1.175 08/21/2014    No results found for: RPR    Chemistry      Component Value Date/Time   NA 140 08/27/2013 1549   K 3.9 08/27/2013 1549   CL 103 08/27/2013 1549   CO2  28 08/27/2013 1549   BUN 19 08/27/2013 1549   CREATININE 0.59 08/27/2013 1549   CREATININE 0.54 11/01/2011  0500      Component Value Date/Time   CALCIUM 9.6 08/27/2013 1549   ALKPHOS 57 08/27/2013 1549   AST 21 08/27/2013 1549   ALT 13 08/27/2013 1549   BILITOT 0.3 08/27/2013 1549       No results found for: HGBA1C    Lab Results  Component Value Date   WBC 8.2 09/02/2014   HGB 12.8 09/02/2014   HCT 38.8 09/02/2014   MCV 98.0 09/02/2014   PLT 305 09/02/2014    No results found for this or any previous visit (from the past 24 hour(s)).  Assessment and Plan: Problem List Items Addressed This Visit    None       Advanced Directives: Code Status:  FULL   Contact: First and Last Name if other than the patient involved:  Alpa, Salvo [phone]    Jerome, Otter Other   [phone] Daughter In Law       Patient to Follow up with Dr.Mcdiarmidin 1 month(s)

## 2014-11-20 NOTE — Patient Instructions (Signed)
Start Exelon patch to help with memory.  If you tolerate it, will talk about increasing the dose on future visits.   Bunion pads for feet  Stop the Topamax.

## 2014-11-20 NOTE — Assessment & Plan Note (Signed)
New problem No further workup Thin linear skin defect within a crease over the right foot 1st metatarsal head sole prominence.  Recommend protection of this skin with gel bunion pads

## 2014-11-20 NOTE — Assessment & Plan Note (Signed)
Established problem Slight improvement in behavior.  Patient did not take the prescribed lamictal.  FAST Stage: Four (difficulty with iADLs but able to perform most ADLs) Palliative Performance Score: 70%.  Behavioral and Psychological Symptoms of Dementia: Paranoid beliefs, resistive to help, impulsive as evidenced by taking bus and getting lost, and trying to get in stranger's car at grocery store, wandering behavior, and weight loss.  Patient expressing loss of power and control, unwanted dependency and loss of former meaning and purpose in life.   Nonpharmacologic treatments of patient's behavioral issues: Focus on the person, not the task.    Pause to assess the person and the situation.    Break tasks into steps, allowing the person to do what he or she can do Individually.  Respond to the patient's emotions; don't argue logically.     Use the patient's agenda.  Slow down; follow the patient's lead.  Redirect the patient with a positive approach.   If things are not going well, leave and try again later. Pharmacologic treatments of patient: Empiric trial of Exelon patch   Start 4.6 mg/24 hr patch daily for 4 weeks, then incr. To 9.5 mg / 24 hours patch daily for 4 weeks, then may incr. To 13.3 mg/24 hr patch daily.  Supportive Interventions for caregiver (support group or individual counseling): Son seeking legal permanent guardianship, , family trying to get patient to go to  [facility] up to 4 times a week.   Physical Activity recommendation: [facility] Long-term care planning: family considering ALF with memory unit  Follow up surveillance appointment in 1 month.

## 2014-12-15 ENCOUNTER — Ambulatory Visit (INDEPENDENT_AMBULATORY_CARE_PROVIDER_SITE_OTHER): Payer: Medicare Other | Admitting: *Deleted

## 2014-12-15 ENCOUNTER — Telehealth: Payer: Self-pay | Admitting: Student

## 2014-12-15 DIAGNOSIS — Z111 Encounter for screening for respiratory tuberculosis: Secondary | ICD-10-CM | POA: Diagnosis not present

## 2014-12-15 NOTE — Telephone Encounter (Signed)
Put forms in PCP's box. Ottis Stain, CMA

## 2014-12-15 NOTE — Telephone Encounter (Signed)
Pt is scheduled to move in to a retirement home on Thurs Daughter brought in [facility] form to be completed Daughter requested she be able to pick up the form Wed morning when her TB test was read

## 2014-12-15 NOTE — Progress Notes (Signed)
   PPD placed Left Forearm.  Pt to return 12/17/14 for reading.  Pt tolerated intradermal injection. Derl Barrow, RN

## 2014-12-16 ENCOUNTER — Telehealth: Payer: Self-pay | Admitting: Clinical

## 2014-12-16 NOTE — Telephone Encounter (Signed)
CSW observed through chart review that pts daughter-in-law is requesting an FL2. CSW contacted pts daughter-in-law Lelan Pons who confirmed that pts son obtained emergency guardianship for pt and today will be their court date. Lelan Pons states that pt is scheduled to go to [facility] on Thursday and the family is in need of an FL2. CSW informed Lelan Pons that CSW would work on the [facility] and attempt to have it completed by her appointment tomorrow however PCP would need to review the form and we would move as quickly as we could. Marie Patent attorney.  Hunt Oris, MSW, [place]

## 2014-12-16 NOTE — Telephone Encounter (Signed)
On FL2, section 11 please check " other-secured unit" Faxed completed form to [facility] at [phone]

## 2014-12-16 NOTE — Telephone Encounter (Signed)
[facility]: On section 11 on FL, please check "other--secured unit"

## 2014-12-17 ENCOUNTER — Encounter: Payer: Self-pay | Admitting: *Deleted

## 2014-12-17 ENCOUNTER — Ambulatory Visit (INDEPENDENT_AMBULATORY_CARE_PROVIDER_SITE_OTHER): Payer: Medicare Other | Admitting: *Deleted

## 2014-12-17 ENCOUNTER — Ambulatory Visit
Admission: RE | Admit: 2014-12-17 | Discharge: 2014-12-17 | Disposition: A | Discharge status: Home / Self Care | Payer: Medicare Other | Source: Ambulatory Visit | Attending: Family Medicine | Admitting: Family Medicine

## 2014-12-17 DIAGNOSIS — R7611 Nonspecific reaction to tuberculin skin test without active tuberculosis: Secondary | ICD-10-CM

## 2014-12-17 LAB — TB SKIN TEST
INDURATION: 14 mm
TB SKIN TEST: POSITIVE

## 2014-12-17 NOTE — Telephone Encounter (Signed)
FL2 has been completed and signed. CSW has faxed the FL2 to [facility] and left the original for pick up by pts daughter in law. Daughter in law Lelan Pons contacted.  Hunt Oris, MSW, [place]

## 2014-12-17 NOTE — Progress Notes (Signed)
   PPD Reading Note PPD read and results entered in [facility]. Result: 14 mm induration. Interpretation: Positive If test not read within 48-72 hours of initial placement, patient advised to repeat in other arm 1-3 weeks after this test. Allergic reaction: no. Patient stated she has had a productive cough for a while now.  Patient denies any other symptoms of TB.  Explained to patient that she does not have TB disease just the TB germ. Patient is getting ready to move into [place] tomorrow.  Patient will be referred to [facility] TB program.  [place] phone number [phone] and fax [phone].  Precept with Dr. Lonny Prude and Dr. Gwendlyn Deutscher; confirmed positive PPD.  Chest X-ray will be ordered by Dr. Lonny Prude.  Patient requesting to have chest x-ray completed today.  Derl Barrow, RN

## 2014-12-17 NOTE — Progress Notes (Signed)
   Chest X-ray negative; confirmed results by Dr. Gwendlyn Deutscher.  Patient and family informed.  Copy of chest x-ray faxed to [facility].  Derl Barrow, RN

## 2014-12-18 ENCOUNTER — Telehealth: Payer: Self-pay | Admitting: Family Medicine

## 2014-12-18 NOTE — Telephone Encounter (Signed)
Patient and Daughter-in-law was contacted yesterday regarding Chest X-ray.  They were informed that the [facility] would be contacting them for an appt with the TB nurses.  Derl Barrow, RN

## 2014-12-18 NOTE — Telephone Encounter (Signed)
Please contact patient about normal xray report. Have her schedule appointment with PCP. She need discuss prophylactic treatment for +PPD. Thanks.

## 2020-08-05 ENCOUNTER — Emergency Department (HOSPITAL_COMMUNITY): Payer: Medicare Other

## 2020-08-05 ENCOUNTER — Observation Stay (HOSPITAL_COMMUNITY)
Admission: EM | Admit: 2020-08-05 | Discharge: 2020-08-06 | Disposition: A | Discharge status: Home / Self Care | Payer: Medicare Other | Attending: Emergency Medicine | Admitting: Emergency Medicine

## 2020-08-05 ENCOUNTER — Encounter (HOSPITAL_COMMUNITY): Payer: Self-pay

## 2020-08-05 ENCOUNTER — Other Ambulatory Visit: Payer: Self-pay

## 2020-08-05 DIAGNOSIS — R930 Abnormal findings on diagnostic imaging of skull and head, not elsewhere classified: Secondary | ICD-10-CM | POA: Diagnosis not present

## 2020-08-05 DIAGNOSIS — D696 Thrombocytopenia, unspecified: Secondary | ICD-10-CM | POA: Diagnosis present

## 2020-08-05 DIAGNOSIS — E872 Acidosis, unspecified: Secondary | ICD-10-CM | POA: Diagnosis present

## 2020-08-05 DIAGNOSIS — Z79899 Other long term (current) drug therapy: Secondary | ICD-10-CM | POA: Diagnosis not present

## 2020-08-05 DIAGNOSIS — F0281 Dementia in other diseases classified elsewhere with behavioral disturbance: Secondary | ICD-10-CM

## 2020-08-05 DIAGNOSIS — R0682 Tachypnea, not elsewhere classified: Secondary | ICD-10-CM | POA: Insufficient documentation

## 2020-08-05 DIAGNOSIS — E870 Hyperosmolality and hypernatremia: Secondary | ICD-10-CM | POA: Diagnosis present

## 2020-08-05 DIAGNOSIS — R4182 Altered mental status, unspecified: Secondary | ICD-10-CM

## 2020-08-05 DIAGNOSIS — I10 Essential (primary) hypertension: Secondary | ICD-10-CM | POA: Diagnosis present

## 2020-08-05 DIAGNOSIS — Z96652 Presence of left artificial knee joint: Secondary | ICD-10-CM | POA: Insufficient documentation

## 2020-08-05 DIAGNOSIS — G309 Alzheimer's disease, unspecified: Secondary | ICD-10-CM | POA: Diagnosis present

## 2020-08-05 DIAGNOSIS — Z20822 Contact with and (suspected) exposure to covid-19: Secondary | ICD-10-CM | POA: Diagnosis not present

## 2020-08-05 DIAGNOSIS — G9341 Metabolic encephalopathy: Secondary | ICD-10-CM | POA: Diagnosis not present

## 2020-08-05 LAB — I-STAT CHEM 8, ED
BUN: 31 mg/dL — ABNORMAL HIGH (ref 8–23)
Calcium, Ion: 1.19 mmol/L (ref 1.15–1.40)
Chloride: 108 mmol/L (ref 98–111)
Creatinine, Ser: 0.9 mg/dL (ref 0.44–1.00)
Glucose, Bld: 107 mg/dL — ABNORMAL HIGH (ref 70–99)
HCT: 35 % — ABNORMAL LOW (ref 36.0–46.0)
Hemoglobin: 11.9 g/dL — ABNORMAL LOW (ref 12.0–15.0)
Potassium: 4 mmol/L (ref 3.5–5.1)
Sodium: 147 mmol/L — ABNORMAL HIGH (ref 135–145)
TCO2: 29 mmol/L (ref 22–32)

## 2020-08-05 LAB — URINALYSIS, COMPLETE (UACMP) WITH MICROSCOPIC
Bacteria, UA: NONE SEEN
Bilirubin Urine: NEGATIVE
Glucose, UA: NEGATIVE mg/dL
Hgb urine dipstick: NEGATIVE
Ketones, ur: NEGATIVE mg/dL
Leukocytes,Ua: NEGATIVE
Nitrite: NEGATIVE
Protein, ur: NEGATIVE mg/dL
Specific Gravity, Urine: 1.024 (ref 1.005–1.030)
pH: 6 (ref 5.0–8.0)

## 2020-08-05 LAB — COMPREHENSIVE METABOLIC PANEL
ALT: 15 U/L (ref 0–44)
AST: 29 U/L (ref 15–41)
Albumin: 3.4 g/dL — ABNORMAL LOW (ref 3.5–5.0)
Alkaline Phosphatase: 80 U/L (ref 38–126)
Anion gap: 10 (ref 5–15)
BUN: 27 mg/dL — ABNORMAL HIGH (ref 8–23)
CO2: 26 mmol/L (ref 22–32)
Calcium: 8.8 mg/dL — ABNORMAL LOW (ref 8.9–10.3)
Chloride: 110 mmol/L (ref 98–111)
Creatinine, Ser: 0.9 mg/dL (ref 0.44–1.00)
GFR, Estimated: >60 mL/min (ref >60)
Glucose, Bld: 105 mg/dL — ABNORMAL HIGH (ref 70–99)
Potassium: 3.6 mmol/L (ref 3.5–5.1)
Sodium: 146 mmol/L — ABNORMAL HIGH (ref 135–145)
Total Bilirubin: 0.5 mg/dL (ref 0.3–1.2)
Total Protein: 6.9 g/dL (ref 6.5–8.1)

## 2020-08-05 LAB — CBC WITH DIFFERENTIAL/PLATELET
Abs Immature Granulocytes: 0.04 10*3/uL (ref 0.00–0.07)
Basophils Absolute: 0 10*3/uL (ref 0.0–0.1)
Basophils Relative: 0 %
Eosinophils Absolute: 0 10*3/uL (ref 0.0–0.5)
Eosinophils Relative: 0 %
HCT: 35.3 % — ABNORMAL LOW (ref 36.0–46.0)
Hemoglobin: 11 g/dL — ABNORMAL LOW (ref 12.0–15.0)
Immature Granulocytes: 0 %
Lymphocytes Relative: 9 %
Lymphs Abs: 1 10*3/uL (ref 0.7–4.0)
MCH: 32.6 pg (ref 26.0–34.0)
MCHC: 31.2 g/dL (ref 30.0–36.0)
MCV: 104.7 fL — ABNORMAL HIGH (ref 80.0–100.0)
Monocytes Absolute: 0.7 10*3/uL (ref 0.1–1.0)
Monocytes Relative: 6 %
Neutro Abs: 9 10*3/uL — ABNORMAL HIGH (ref 1.7–7.7)
Neutrophils Relative %: 85 %
Platelets: 94 10*3/uL — ABNORMAL LOW (ref 150–400)
RBC: 3.37 MIL/uL — ABNORMAL LOW (ref 3.87–5.11)
RDW: 13.1 % (ref 11.5–15.5)
WBC: 10.7 10*3/uL — ABNORMAL HIGH (ref 4.0–10.5)
nRBC: 0 % (ref 0.0–0.2)

## 2020-08-05 LAB — RESP PANEL BY RT-PCR (FLU A&B, COVID) ARPGX2
Influenza A by PCR: NEGATIVE
Influenza B by PCR: NEGATIVE
SARS Coronavirus 2 by RT PCR: NEGATIVE

## 2020-08-05 LAB — LACTIC ACID, PLASMA
Lactic Acid, Venous: 2.5 mmol/L (ref 0.5–1.9)
Lactic Acid, Venous: 1.1 mmol/L (ref 0.5–1.9)

## 2020-08-05 LAB — CBG MONITORING, ED: Glucose-Capillary: 105 mg/dL — ABNORMAL HIGH (ref 70–99)

## 2020-08-05 LAB — MAGNESIUM: Magnesium: 2.1 mg/dL (ref 1.7–2.4)

## 2020-08-05 MED ORDER — SODIUM CHLORIDE 0.45 % IV SOLN: INTRAVENOUS | Status: DC

## 2020-08-05 MED ORDER — SODIUM CHLORIDE 0.9 % IV BOLUS
500.0000 mL | Freq: Once | INTRAVENOUS | Status: AC
  Administered 2020-08-05: 500 mL via INTRAVENOUS

## 2020-08-05 MED ORDER — ASPIRIN 300 MG RE SUPP
300.0000 mg | Freq: Once | RECTAL | Status: AC
  Administered 2020-08-05: 300 mg via RECTAL
  Filled 2020-08-05: qty 1

## 2020-08-05 NOTE — ED Provider Notes (Signed)
6:52 PM Care sent from Dr. Phylliss Bob while waiting for results of urinalysis and MRI.  Patient presents for altered mental status and family thinks it is related to UTI which he has had in the past.  Patient normally is able to answer questions and say her name but today she has not been speaking.  Family also think she has been altered.  They think she may been altered for the last few days as last time I saw her was this weekend.  Work-up began to returned showing she does have hyponatremia but no AKI.  COVID and flu test negative.  Mild leukocytosis present and lactic acid is elevated 2.5.  CT head was initially performed although there is no evidence of trauma reported.  Patient has focal area of decreased attenuation concerning for possible recent or acute infarct.  Neurology was called by Wyn Quaker, PA-C and they recommended MRI and agree with admission to medicine at [facility] long for further evaluation and management of altered mental status in the setting of possible new stroke.  We will wait for urinalysis returned to make sure she does not have an infection but will call for admission after urinalysis done.  Anticipate admission.   Clinical Impression: 1. Altered mental status, unspecified altered mental status type   2. Abnormal CT scan of head     Disposition: Admit  This note was prepared with assistance of Dragon voice recognition software. Occasional wrong-word or sound-a-like substitutions may have occurred due to the inherent limitations of voice recognition software.     Wanda Conner, Wanda Allegra, MD 08/05/20 2317

## 2020-08-05 NOTE — ED Notes (Signed)
Patient transported to CT 

## 2020-08-05 NOTE — ED Notes (Signed)
Peri care performed, patient changed, and Purewick placed to collect urine specimen.

## 2020-08-05 NOTE — H&P (Signed)
History and Physical    Wanda Conner ZOX:[id number] DOB: 1933-09-04 DOA: 08/05/2020  PCP: Patient, [name]  Patient coming from: SNF   Chief Complaint:  Chief Complaint  Patient presents with  . Altered Mental Status     HPI:    87 year old woman with past medical history of Alzheimer dementia (dx 2014), hypertension, gastroesophageal reflux disease who presents to [facility] emergency department due to progressively worsening lethargy and confusion.  Patient is extremely poor historian secondary to advanced Alzheimer's dementia.  26 of the history is been obtained from discussion with the son via phone conversation as well as discussion with emergency department staff and review of emergency department notes.  Assisted living facility staff reports a 1 day history of progressively worsening lethargy and confusion.  However, son reports that patient has been exhibiting these progressive symptoms for weeks and perhaps even months.  Upon EMS arrival, patient had an initial temperature of 100.6 but when this temperature was rechecked just several minutes later patient was afebrile bringing that initial temperature into question.  There have been no reports of recent nausea, vomiting, pain, diarrhea, cough shortness of breath or dysuria.  Patient has been exhibiting poor oral intake as of late.  Upon patient arrival at [facility] emergency department, initial work-up revealed no definitive source of infection.  CT imaging revealed a focus of decreased attenuation in the anterior to mid right parietal lobe bringing up concern for possible stroke.  Case was then discussed with Dr. Quinn Axe with neurology who recommended obtaining MRI brain.  The hospitalist group was then called to assess the patient for admission to the hospital.  Review of Systems:   Review of Systems  Unable to perform ROS: Mental acuity    Past Medical History:  Diagnosis Date  . Agitation  09/02/2014  . Anemia    History of this  . Arthritis   . Dementia with behavioral disturbance ([place]) 08/21/2014  . Depression   . Elevated vitamin B12 level 09/02/2014  . GERD (gastroesophageal reflux disease)   . Hypertension, essential, benign 11/21/2011  . Osteoarthritis of left knee 10/31/2011   Status post knee replacement on 10/31/2011.   Marland Kitchen Palpitations 12/12/2012  . Personal history of peptic ulcer disease 11/12/2009   Qualifier: Diagnosis of  By: Mingo Amber MD, Merry Proud    . Seasonal allergies   . Urge incontinence 12/29/2010  . Uterine prolapse 10/25/2013  . Weight loss, unintentional 09/18/2014    Past Surgical History:  Procedure Laterality Date  . BREAST SURGERY     cyst removal  . JOINT REPLACEMENT     Right kee replacement 2009  . TOTAL KNEE ARTHROPLASTY  10/31/2011   Procedure: TOTAL KNEE ARTHROPLASTY;  Surgeon: Kerin Salen, MD;  Location: [place];  Service: Orthopedics;  Laterality: Left;     reports that she has never smoked. She has never used smokeless tobacco. She reports that she does not drink alcohol and does not use drugs.  Allergies  Allergen Reactions  . Omeprazole Other (See Comments)    "did me worse than it did good". Pt does not like this medication to treat her stomach symtoms    Family History  Problem Relation Age of Onset  . Diabetes Father   . Diabetes Brother      Prior to Admission medications   Medication Sig Start Date End Date Taking? Authorizing Provider  acetaminophen (TYLENOL) 500 MG tablet Take 500 mg by mouth every 4 (four) hours as  needed for headache, mild pain or fever.   Yes [provider]  alum & mag hydroxide-simeth (MAALOX/MYLANTA) 200-200-20 MG/5ML suspension Take 30 mLs by mouth daily as needed for indigestion or heartburn.   Yes [provider]  amLODipine (NORVASC) 5 MG tablet Take 5 mg by mouth daily.   Yes [provider]  Cholecalciferol (VITAMIN D) 50 MCG (2000 UT) CAPS Take 2,000 Units by mouth daily.    Yes [provider]  divalproex (DEPAKOTE SPRINKLE) 125 MG capsule Take 125 mg by mouth 2 (two) times daily.   Yes [provider]  donepezil (ARICEPT) 10 MG tablet Take 10 mg by mouth daily. Evening   Yes [provider]  guaifenesin (ROBITUSSIN) 100 MG/5ML syrup Take 200 mg by mouth 4 (four) times daily as needed for cough.   Yes [provider]  loperamide (IMODIUM) 2 MG capsule Take 2 mg by mouth daily as needed for diarrhea or loose stools.   Yes [provider]  losartan (COZAAR) 100 MG tablet Take 100 mg by mouth daily.   Yes [provider]  magnesium hydroxide (MILK OF MAGNESIA) 400 MG/5ML suspension Take 30 mLs by mouth at bedtime as needed for mild constipation.   Yes [provider]  Multiple Vitamins-Minerals (THERA-M MULTIPLE VITAMINS PO) Take 1 tablet by mouth daily.   Yes [provider]  Neomycin-Bacitracin-Polymyxin (TRIPLE ANTIBIOTIC FIRST AID) [phone] OINT Apply 1 application topically daily as needed (minor skin tears or abrasions).   Yes [provider]  sertraline (ZOLOFT) 25 MG tablet Take 25 mg by mouth daily.   Yes [provider]  rivastigmine (EXELON) 4.6 mg/24hr Place 1 patch (4.6 mg total) onto the skin daily. Patient not taking: No sig reported 11/20/14   McDiarmid, Blane Ohara, MD    Physical Exam: Vitals:   08/05/20 1946 08/05/20 2000 08/06/20 0045 08/06/20 0200  BP: 101/76 (!) 140/44 (!) 107/93 (!) 154/61  Pulse: 65 62 63 (!) 59  Resp: (!) _0 Temp: 97.8 F (36.6 C)     TempSrc: Oral     SpO2: 99% 97% 95% 97%    Constitutional: Patient is extremely lethargic and minimally arousable.  Patient is currently not in acute distress. Skin: no rashes, no lesions, somewhat poor skin turgor noted.   Eyes: Pupils are equally reactive to light.  No evidence of scleral icterus or conjunctival pallor.  ENMT: Dry mucous membranes noted.  Patient not allowing examination of  posterior oropharynx.  Neck: normal, supple, no masses, no thyromegaly.  No evidence of jugular venous distension.   Respiratory: clear to auscultation bilaterally, no wheezing, no crackles. Normal respiratory effort. No accessory muscle use.  Cardiovascular: Regular rate and rhythm, no murmurs / rubs / gallops. No extremity edema. 2+ pedal pulses. No carotid bruits.  Chest:   Nontender without crepitus or deformity.   Back:   Nontender without crepitus or deformity. Abdomen: Abdomen is soft and nontender.  No evidence of intra-abdominal masses.  Positive bowel sounds noted in all quadrants.   Musculoskeletal: No joint deformity upper and lower extremities. Good ROM, no contractures. Normal muscle tone.  Neurologic: Patient is moving all 4 extremities spontaneously.  Patient is responsive to verbal and painful stimuli.  Patient does localize to pain.  Patient is not following commands. Psychiatric: Unable to fully evaluate due to significant lethargy.  Patient currently does not seem to possess insight as to her current situation.  Labs on Admission: I have personally reviewed  following labs and imaging studies -   CBC: Recent Labs  Lab 08/05/20 1509 08/05/20 1524  WBC 10.7*  --   NEUTROABS 9.0*  --   HGB 11.0* 11.9*  HCT 35.3* 35.0*  MCV 104.7*  --   PLT 94*  --    Basic Metabolic Panel: Recent Labs  Lab 08/05/20 1509 08/05/20 1524  NA 146* 147*  K 3.6 4.0  CL 110 108  CO2 26  --   GLUCOSE 105* 107*  BUN 27* 31*  CREATININE 0.90 0.90  CALCIUM 8.8*  --   MG 2.1  --    GFR: CrCl cannot be calculated (Unknown ideal weight.). Liver Function Tests: Recent Labs  Lab 08/05/20 1509  AST 29  ALT 15  ALKPHOS 80  BILITOT 0.5  PROT 6.9  ALBUMIN 3.4*   No results for input(s): LIPASE, AMYLASE in the last 168 hours. No results for input(s): AMMONIA in the last 168 hours. Coagulation Profile: No results for input(s): INR, PROTIME in the last 168 hours. Cardiac Enzymes: No  results for input(s): CKTOTAL, CKMB, CKMBINDEX, TROPONINI in the last 168 hours. BNP (last 3 results) No results for input(s): PROBNP in the last 8760 hours. HbA1C: No results for input(s): HGBA1C in the last 72 hours. CBG: Recent Labs  Lab 08/05/20 1439  GLUCAP 105*   Lipid Profile: No results for input(s): CHOL, HDL, LDLCALC, TRIG, CHOLHDL, LDLDIRECT in the last 72 hours. Thyroid Function Tests: Recent Labs    08/05/20 2300  TSH 2.740   Anemia Panel: No results for input(s): VITAMINB12, FOLATE, FERRITIN, TIBC, IRON, RETICCTPCT in the last 72 hours. Urine analysis:    Component Value Date/Time   COLORURINE YELLOW 08/05/2020 1815   APPEARANCEUR CLEAR 08/05/2020 1815   LABSPEC 1.024 08/05/2020 1815   PHURINE 6.0 08/05/2020 1815   GLUCOSEU NEGATIVE 08/05/2020 1815   HGBUR NEGATIVE 08/05/2020 1815   BILIRUBINUR NEGATIVE 08/05/2020 1815   KETONESUR NEGATIVE 08/05/2020 1815   PROTEINUR NEGATIVE 08/05/2020 1815   UROBILINOGEN 0.2 10/25/2011 1057   NITRITE NEGATIVE 08/05/2020 1815   LEUKOCYTESUR NEGATIVE 08/05/2020 1815    Radiological Exams on Admission - Personally Reviewed: CT Head Wo Contrast  Result Date: 08/05/2020 CLINICAL DATA:  Altered mental status EXAM: CT HEAD WITHOUT CONTRAST TECHNIQUE: Contiguous axial images were obtained from the base of the skull through the vertex without intravenous contrast. COMPARISON:  September 12, 2014 FINDINGS: Brain: Moderate diffuse atrophy is again noted. There is no intracranial mass, hemorrhage, extra-axial fluid collection, or midline shift. There is a focal area of decreased attenuation in the anterior superior to mid right parietal lobe, not present on prior study. This decreased attenuation involves foci of gray and white matter in this area. Elsewhere, there is decreased attenuation throughout portions of the centra semiovale bilaterally. There are foci of basal ganglia calcification, likely physiologic. Vascular: No hyperdense  vessels. There is calcification in each carotid siphon region. Skull: The bony calvarium appears intact. Sinuses/Orbits: Visualized paranasal sinuses are clear. Orbits appear symmetric bilaterally. Other: Mastoid air cells are clear. There is debris in each external auditory canal. IMPRESSION: 1. Focus of decreased attenuation in the anterior to mid right parietal lobe, concerning for recent and potentially acute infarct. 2.  Atrophy with periventricular small vessel disease. 3.  No mass or hemorrhage. 4.  Foci of arterial vascular calcification noted. 5.  Probable cerumen in each external auditory canal. Electronically Signed   By: Lowella Grip III M.D.   On: 08/05/2020 16:27   DG  Chest Portable 1 View  Result Date: 08/05/2020 CLINICAL DATA:  Altered mental status EXAM: PORTABLE CHEST 1 VIEW COMPARISON:  December 17, 2014 FINDINGS: There is mild left base atelectasis. Lungs elsewhere are clear. Heart is upper normal in size with pulmonary vascularity normal. No adenopathy. There is arthropathy in the right shoulder. IMPRESSION: Mild left base atelectasis. No edema or airspace opacity. Heart upper normal in size. Electronically Signed   By: Lowella Grip III M.D.   On: 08/05/2020 15:16    Telemetry: Personally reviewed.  Rhythm is normal sinus rhythm with heart rate of 90 bpm.    Assessment/Plan Principal Problem:   Acute metabolic encephalopathy   Staff at patient's assisted living facility reports 1 day history of worsening lethargy and confusion  However upon discussion with patient's son who visits her regular patient has had gradual decline over the course of weeks to months  There is a question as to whether patient is suffering from acute metabolic encephalopathy  No obvious evidence of infection with unremarkable urinalysis and chest x-ray.  Clinically, patient does not appear to have clinical signs of meningitis.  COVID-19 testing negative.  TSH, vitamin B12, ammonia, ESR and CRP  pending  Obtaining MRI brain per neurology's recommendation considering question of acute stroke however clinically this is unlikely and even if there was a stroke confusion is typically not the primary presenting symptom.  We will additionally obtain EEG considering ambiguity of patient's presentation  At this point, volume depletion and mild hyponatremia may be the only contributing factors to patient's encephalopathy superimposed on advanced Alzheimer's disease  Hydrating patient with intravenous fluids, monitoring for symptomatic improvement  If work-up completely negative and patient does not respond to hydration then patient may simply be exhibiting signs of advanced progressive dementia.  Active Problems:   Hypernatremia   Mild hypernatremia with clinical signs of volume depletion  See assessment and plan above    Hypertension, essential, benign   Continue home regimen of antihypertensive therapy while monitoring for episodic hypotension considering concerns for volume depletion    Alzheimer's dementia with behavioral disturbance (HCC)    Presence of known advanced Alzheimer's dementia is complicating assessment of patient's suspected encephalopathy  Thrombocytopenia  No clinical evidence of bleeding  Etiology unclear  Monitoring with serial CBCs  Lactic acidosis   Likely secondary to volume depletion  Hydrating patient with intravenous isotonic fluids  Performing serial lactic acid levels to ensure downtrending and resolution   Code Status:  DNR Family Communication: Discussed plan of care with son via phone conversation.  Son is patient's guardian.  Status is: Observation  The patient remains OBS appropriate and will d/c before 2 midnights.  Dispo: The patient is from: SNF              Anticipated d/c is to: SNF              Patient currently is not medically stable to d/c.   Difficult to place patient No        Vernelle Emerald MD [facility] Pager [phone]  If 7PM-7AM, please contact night-coverage [email or web address] Use universal [place] password for that web site. If you do not have the password, please call the hospital operator.  08/06/2020, 5:06 AM

## 2020-08-05 NOTE — ED Triage Notes (Signed)
Via EMS from [place] facility. Staff reports AMS from baseline beginning right before noon. Pt has hx of dementia. Temp 101.6 initially with EMS then 97.52F without medication so unsure of accuracy on temp.

## 2020-08-05 NOTE — ED Notes (Signed)
Critical Result  Lactic 2.5

## 2020-08-05 NOTE — ED Provider Notes (Signed)
[facility] DEPT Provider Note   CSN: [id number] Arrival date & time: 08/05/20  1344     History Chief Complaint  Patient presents with  . Altered Mental Status    Wanda Conner is a 87 y.o. female with past medical history of dementia who presents today for evaluation of altered mental status.   Level 5 caveat patient is nonverbal.   Per Olivia Mackie at [facility] where patient resides patient was last seen normal at 10 AM.  At some point after that she began developing a tremor and shaking.  I asked if this was violent jerking or more of a tremor and was told this was a tremor.  She at that time reportedly had a temp of 100.4. Olivia Mackie states that patient is normally able to tell you what her name is, will move all 4 extremities and follow commands.  She was not given any medication for her Tylenol and has not had any new medications or changes in dose.  Patients son reports that he suspected patient may have a UTI "a few days ago" however he reports she was not started on antibiotics.  He states that she reportedly has acted similar when she had a UTI in the past and her behavior change started a few days ago.    Per report she has not been coughing.     HPI     Past Medical History:  Diagnosis Date  . Agitation 09/02/2014  . Anemia    History of this  . Arthritis   . Dementia with behavioral disturbance ([place]) 08/21/2014  . Depression   . Elevated vitamin B12 level 09/02/2014  . GERD (gastroesophageal reflux disease)   . Hypertension, essential, benign 11/21/2011  . Osteoarthritis of left knee 10/31/2011   Status post knee replacement on 10/31/2011.   Marland Kitchen Palpitations 12/12/2012  . Personal history of peptic ulcer disease 11/12/2009   Qualifier: Diagnosis of  By: Mingo Amber MD, Merry Proud    . Seasonal allergies   . Urge incontinence 12/29/2010  . Uterine prolapse 10/25/2013  . Weight loss, unintentional 09/18/2014    Patient Active Problem List   Diagnosis  Date Noted  . Open wound of foot except toe(s) alone 11/20/2014  . Weight loss, unintentional 09/18/2014  . Elevated vitamin B12 level 09/02/2014  . Dementia with behavioral disturbance ([place]) 08/21/2014  . Uterine prolapse 10/25/2013  . Bony growth 12/12/2012  . Hypertension, essential, benign 11/21/2011  . Osteoarthritis of left knee 10/31/2011  . Preventative health care 12/29/2010  . Urge incontinence 12/29/2010    Past Surgical History:  Procedure Laterality Date  . BREAST SURGERY     cyst removal  . JOINT REPLACEMENT     Right kee replacement 2009  . TOTAL KNEE ARTHROPLASTY  10/31/2011   Procedure: TOTAL KNEE ARTHROPLASTY;  Surgeon: Kerin Salen, MD;  Location: [facility];  Service: Orthopedics;  Laterality: Left;     OB History   No obstetric history on file.     Family History  Problem Relation Age of Onset  . Diabetes Father   . Diabetes Brother     Social History   Tobacco Use  . Smoking status: Never Smoker  . Smokeless tobacco: Never Used  Substance Use Topics  . Alcohol use: No  . Drug use: No    Home Medications Prior to Admission medications   Medication Sig Start Date End Date Taking? Authorizing Provider  acetaminophen (TYLENOL) 500 MG tablet Take 500 mg by mouth  every 6 (six) hours as needed.    [provider]  bisacodyl (DULCOLAX) 5 MG EC tablet Take 5 mg by mouth daily as needed for moderate constipation.    [provider]  Ferrous Sulfate (IRON) 28 MG TABS Take by mouth.    [provider]  Ginkgo Biloba 40 MG TABS Take by mouth.    [provider]  loratadine (CLARITIN) 10 MG tablet Take 10 mg by mouth daily.    [provider]  Multiple Minerals (CALCIUM-MAGNESIUM-ZINC) TABS Take by mouth.    [provider]  Multiple Vitamin (MULTIVITAMIN WITH MINERALS) TABS tablet Take 1 tablet by mouth daily.    [provider]  Omega-3 Fatty Acids (FISH OIL PO) Take 1 capsule by mouth every  morning.    [provider]  Potassium 95 MG TABS Take by mouth.    [provider]  rivastigmine (EXELON) 4.6 mg/24hr Place 1 patch (4.6 mg total) onto the skin daily. 11/20/14   McDiarmid, Blane Ohara, MD    Allergies    Omeprazole  Review of Systems   Review of Systems  Unable to perform ROS: Mental status change    Physical Exam Updated Vital Signs BP (!) 175/163   Pulse 88   Temp 99.4 F (37.4 C) (Rectal)   Resp (!) 24   SpO2 97%   Physical Exam Vitals and nursing note reviewed.  Constitutional:      Appearance: She is ill-appearing.  HENT:     Head: Normocephalic and atraumatic.  Eyes:     General: No scleral icterus.       Right eye: No discharge.        Left eye: No discharge.     Conjunctiva/sclera: Conjunctivae normal.  Cardiovascular:     Rate and Rhythm: Normal rate and regular rhythm.     Heart sounds: Normal heart sounds.  Pulmonary:     Effort: Pulmonary effort is normal. Tachypnea present. No respiratory distress.     Breath sounds: No stridor.  Abdominal:     General: There is no distension.  Musculoskeletal:        General: No deformity.     Cervical back: Normal range of motion.  Skin:    General: Skin is warm and dry.  Neurological:     Mental Status: She is lethargic.     GCS: GCS eye subscore is 3. GCS verbal subscore is 2. GCS motor subscore is 6.     Comments: Patient is slumped to the right side.  Is able to wiggle toes and follow commands to squeeze hands.   Psychiatric:        Behavior: Behavior normal.     ED Results / Procedures / Treatments   Labs (all labs ordered are listed, but only abnormal results are displayed) Labs Reviewed  COMPREHENSIVE METABOLIC PANEL - Abnormal; Notable for the following components:      Result Value   Sodium 146 (*)    Glucose, Bld 105 (*)    BUN 27 (*)    Calcium 8.8 (*)    Albumin 3.4 (*)    All other components within normal limits  CBC WITH DIFFERENTIAL/PLATELET - Abnormal;  Notable for the following components:   WBC 10.7 (*)    RBC 3.37 (*)    Hemoglobin 11.0 (*)    HCT 35.3 (*)    MCV 104.7 (*)    Platelets 94 (*)    Neutro Abs 9.0 (*)  All other components within normal limits  LACTIC ACID, PLASMA - Abnormal; Notable for the following components:   Lactic Acid, Venous 2.5 (*)    All other components within normal limits  CBG MONITORING, ED - Abnormal; Notable for the following components:   Glucose-Capillary 105 (*)    All other components within normal limits  I-STAT CHEM 8, ED - Abnormal; Notable for the following components:   Sodium 147 (*)    BUN 31 (*)    Glucose, Bld 107 (*)    Hemoglobin 11.9 (*)    HCT 35.0 (*)    All other components within normal limits  RESP PANEL BY RT-PCR (FLU A&B, COVID) ARPGX2  CULTURE, BLOOD (ROUTINE X 2)  CULTURE, BLOOD (ROUTINE X 2)  URINE CULTURE  MAGNESIUM  URINALYSIS, COMPLETE (UACMP) WITH MICROSCOPIC  LACTIC ACID, PLASMA    EKG EKG Interpretation  Date/Time:  Wednesday August 05 2020 14:41:03 EDT Ventricular Rate:  71 PR Interval:    QRS Duration: 139 QT Interval:  471 QTC Calculation: 512 R Axis:   -61 Text Interpretation: Sinus rhythm Consider right atrial enlargement RBBB and LAFB Left ventricular hypertrophy No significant change since last tracing Confirmed by Isla Pence [phone]) on 08/05/2020 2:51:49 PM   Radiology CT Head Wo Contrast  Result Date: 08/05/2020 CLINICAL DATA:  Altered mental status EXAM: CT HEAD WITHOUT CONTRAST TECHNIQUE: Contiguous axial images were obtained from the base of the skull through the vertex without intravenous contrast. COMPARISON:  September 12, 2014 FINDINGS: Brain: Moderate diffuse atrophy is again noted. There is no intracranial mass, hemorrhage, extra-axial fluid collection, or midline shift. There is a focal area of decreased attenuation in the anterior superior to mid right parietal lobe, not present on prior study. This decreased attenuation involves  foci of gray and white matter in this area. Elsewhere, there is decreased attenuation throughout portions of the centra semiovale bilaterally. There are foci of basal ganglia calcification, likely physiologic. Vascular: No hyperdense vessels. There is calcification in each carotid siphon region. Skull: The bony calvarium appears intact. Sinuses/Orbits: Visualized paranasal sinuses are clear. Orbits appear symmetric bilaterally. Other: Mastoid air cells are clear. There is debris in each external auditory canal. IMPRESSION: 1. Focus of decreased attenuation in the anterior to mid right parietal lobe, concerning for recent and potentially acute infarct. 2.  Atrophy with periventricular small vessel disease. 3.  No mass or hemorrhage. 4.  Foci of arterial vascular calcification noted. 5.  Probable cerumen in each external auditory canal. Electronically Signed   By: Lowella Grip III M.D.   On: 08/05/2020 16:27   DG Chest Portable 1 View  Result Date: 08/05/2020 CLINICAL DATA:  Altered mental status EXAM: PORTABLE CHEST 1 VIEW COMPARISON:  December 17, 2014 FINDINGS: There is mild left base atelectasis. Lungs elsewhere are clear. Heart is upper normal in size with pulmonary vascularity normal. No adenopathy. There is arthropathy in the right shoulder. IMPRESSION: Mild left base atelectasis. No edema or airspace opacity. Heart upper normal in size. Electronically Signed   By: Lowella Grip III M.D.   On: 08/05/2020 15:16    Procedures Procedures   Medications Ordered in ED Medications  aspirin suppository 300 mg (has no administration in time range)  sodium chloride 0.9 % bolus 500 mL (500 mLs Intravenous New Bag/Given 08/05/20 1616)  sodium chloride 0.9 % bolus 500 mL (500 mLs Intravenous New Bag/Given 08/05/20 1818)    ED Course  I have reviewed the triage vital signs and the nursing  notes.  Pertinent labs & imaging results that were available during my care of the patient were reviewed by me and  considered in my medical decision making (see chart for details).  Clinical Course as of 08/05/20 1843  Wed Aug 05, 2020  1508 Attempted to call son x2 with no answer [EH]  1740 Called on call neurology x2 and paged, awaiting call back.  [EH]  1746 Attempted to update son who had been in room, however he is no longer present.  Patient condition unchanged.   [EH]  [phone] I spoke with Dr. Quinn Axe with neurology who recommends MRI brain with out contrast, states patient is ok to stay at [facility], reccoemnds one time full dose asa followed by 52m daily.  [EH]    Clinical Course User Index [EH] HOllen Gross  MDM Rules/Calculators/A&P                         Patient presents today for evaluation of altered mental status.  Per her son she was acting abnormal a few days ago and he suspected a UTI, however today at some point between 7-10am AM she was normal however at some point after that but PTA she was noted to be non verbal.    Patient does not appear to have consistent unilateral/lateralizing symptoms or weakness.    CBC shows white count of 10.7, hemoglobin is slightly low at 11, no recent priors for comparison.  CMP shows hyponatremia with a sodium of 146.  BUN is slightly elevated at 24 however otherwise without significant derangements.  Lactic acid is slightly elevated at 2.5.  Magnesium is normal at 2.1.  At this time urine is still pending.  Patient has intermittently met SIRS criteria however no clear source of infection currently. Given that she does not have clear source of infection and is afebrile here she has not been started on sepsis fluids or antibiotics. CT head was obtained which showed right parietal abnormality, I spoke with neurology Dr. SQuinn Axewho recommends MRI brain without contrast, [facility] recommends a one-time full dose aspirin which will be given suppository as I do not think patient is safe for p.o.  Additionally she  recommends general encephalopathy work-up and that patient then be given 81 mg of aspirin.   This time urine is still pending.  Covid test is negative.    At shift change care was transferred to Dr. TSherry Ruffing who will follow pending studies, re-evaulate and determine disposition.     Final Clinical Impression(s) / ED Diagnoses Final diagnoses:  Altered mental status, unspecified altered mental status type  Abnormal CT scan of head    Rx / DC Orders ED Discharge Orders    None       HOllen Gross03/16/22 1849    Tegeler, CGwenyth Allegra MD 08/05/20 [phone]

## 2020-08-05 NOTE — ED Notes (Addendum)
(  This note inserted due to error with Epic, please disregard.)

## 2020-08-06 DIAGNOSIS — E872 Acidosis, unspecified: Secondary | ICD-10-CM | POA: Diagnosis present

## 2020-08-06 DIAGNOSIS — F02818 Dementia in other diseases classified elsewhere, unspecified severity, with other behavioral disturbance: Secondary | ICD-10-CM | POA: Diagnosis present

## 2020-08-06 DIAGNOSIS — E870 Hyperosmolality and hypernatremia: Secondary | ICD-10-CM | POA: Diagnosis present

## 2020-08-06 DIAGNOSIS — G9341 Metabolic encephalopathy: Secondary | ICD-10-CM | POA: Diagnosis not present

## 2020-08-06 DIAGNOSIS — F0281 Dementia in other diseases classified elsewhere with behavioral disturbance: Secondary | ICD-10-CM | POA: Diagnosis present

## 2020-08-06 DIAGNOSIS — G309 Alzheimer's disease, unspecified: Secondary | ICD-10-CM | POA: Diagnosis present

## 2020-08-06 DIAGNOSIS — D696 Thrombocytopenia, unspecified: Secondary | ICD-10-CM | POA: Diagnosis present

## 2020-08-06 LAB — MAGNESIUM: Magnesium: 2 mg/dL (ref 1.7–2.4)

## 2020-08-06 LAB — COMPREHENSIVE METABOLIC PANEL
ALT: 18 U/L (ref 0–44)
AST: 44 U/L — ABNORMAL HIGH (ref 15–41)
Albumin: 2.7 g/dL — ABNORMAL LOW (ref 3.5–5.0)
Alkaline Phosphatase: 63 U/L (ref 38–126)
Anion gap: 9 (ref 5–15)
BUN: 17 mg/dL (ref 8–23)
CO2: 23 mmol/L (ref 22–32)
Calcium: 8 mg/dL — ABNORMAL LOW (ref 8.9–10.3)
Chloride: 111 mmol/L (ref 98–111)
Creatinine, Ser: 0.6 mg/dL (ref 0.44–1.00)
GFR, Estimated: >60 mL/min (ref >60)
Glucose, Bld: 86 mg/dL (ref 70–99)
Potassium: 3.2 mmol/L — ABNORMAL LOW (ref 3.5–5.1)
Sodium: 143 mmol/L (ref 135–145)
Total Bilirubin: 0.1 mg/dL — ABNORMAL LOW (ref 0.3–1.2)
Total Protein: 5.5 g/dL — ABNORMAL LOW (ref 6.5–8.1)

## 2020-08-06 LAB — AMMONIA: Ammonia: 10 umol/L (ref 9–35)

## 2020-08-06 LAB — TSH: TSH: 2.74 u[IU]/mL (ref 0.350–4.500)

## 2020-08-06 LAB — C-REACTIVE PROTEIN: CRP: 1 mg/dL — ABNORMAL HIGH (ref <1.0)

## 2020-08-06 LAB — VITAMIN B12: Vitamin B-12: 550 pg/mL (ref 180–914)

## 2020-08-06 MED ORDER — ONDANSETRON HCL 4 MG PO TABS: 4.0000 mg | ORAL_TABLET | Freq: Four times a day (QID) | ORAL | Status: DC | PRN

## 2020-08-06 MED ORDER — ENOXAPARIN SODIUM 40 MG/0.4ML ~~LOC~~ SOLN: 40.0000 mg | SUBCUTANEOUS | Status: DC

## 2020-08-06 MED ORDER — SERTRALINE HCL 50 MG PO TABS
25.0000 mg | ORAL_TABLET | Freq: Every day | ORAL | Status: DC
  Administered 2020-08-06: 25 mg via ORAL
  Filled 2020-08-06: qty 1

## 2020-08-06 MED ORDER — ACETAMINOPHEN 650 MG RE SUPP: 650.0000 mg | Freq: Four times a day (QID) | RECTAL | Status: DC | PRN

## 2020-08-06 MED ORDER — ENSURE MAX PROTEIN PO LIQD
11.0000 [oz_av] | Freq: Every day | ORAL | Status: DC
  Filled 2020-08-06: qty 330

## 2020-08-06 MED ORDER — DONEPEZIL HCL 5 MG PO TABS: 10.0000 mg | ORAL_TABLET | Freq: Every day | ORAL | Status: DC

## 2020-08-06 MED ORDER — ACETAMINOPHEN 325 MG PO TABS: 650.0000 mg | ORAL_TABLET | Freq: Four times a day (QID) | ORAL | Status: DC | PRN

## 2020-08-06 MED ORDER — ONDANSETRON HCL 4 MG/2ML IJ SOLN: 4.0000 mg | Freq: Four times a day (QID) | INTRAMUSCULAR | Status: DC | PRN

## 2020-08-06 MED ORDER — HYDRALAZINE HCL 20 MG/ML IJ SOLN: 10.0000 mg | Freq: Four times a day (QID) | INTRAMUSCULAR | Status: DC | PRN

## 2020-08-06 MED ORDER — DIVALPROEX SODIUM 125 MG PO CSDR
125.0000 mg | DELAYED_RELEASE_CAPSULE | Freq: Two times a day (BID) | ORAL | Status: DC
  Filled 2020-08-06: qty 1

## 2020-08-06 MED ORDER — AMLODIPINE BESYLATE 5 MG PO TABS
5.0000 mg | ORAL_TABLET | Freq: Every day | ORAL | Status: DC
  Administered 2020-08-06: 5 mg via ORAL
  Filled 2020-08-06: qty 1

## 2020-08-06 MED ORDER — LOSARTAN POTASSIUM 25 MG PO TABS
100.0000 mg | ORAL_TABLET | Freq: Every day | ORAL | Status: DC
  Administered 2020-08-06: 100 mg via ORAL
  Filled 2020-08-06: qty 4

## 2020-08-06 MED ORDER — POLYETHYLENE GLYCOL 3350 17 G PO PACK: 17.0000 g | PACK | Freq: Every day | ORAL | Status: DC | PRN

## 2020-08-06 NOTE — Discharge Summary (Signed)
Physician Discharge Summary  Wanda Conner OFB:[id number] DOB: 07-10-1933 DOA: 08/05/2020  PCP: Patient, [name]  Admit date: 08/05/2020 Discharge date: 08/06/2020  Admitted From: ALF Discharge disposition: Back to ALF   Code Status: DNR  Diet Recommendation: Pured diet as tolerated  Discharge Diagnosis:   Principal Problem:   Acute metabolic encephalopathy Active Problems:   Hypertension, essential, benign   Hypernatremia   Alzheimer's dementia with behavioral disturbance ([place])   Thrombocytopenia ([place])   Lactic acidosis   Chief Complaint  Patient presents with  . Altered Mental Status   Brief narrative: Wanda Conner is a 87 y.o. female with PMH significant for Alzheimer's dementia, HTN, GERD.  Patient was brought to the ED from an assisted living facility on 3/16 for progressively worsening lethargy and confusion for 24 hours. EMS noted a temperature 100.6 In the ED, afebrile and hemodynamically stable Lab with sodium slightly elevated 146, renal function normal, lactic acid elevated 2.5, WBC count 10.7, hemoglobin 11.1, platelet count 94 CT head showed focus of decreased attenuation in the anterior to mid right parietal lobe, concerning for recent and potentially acute infarct. ED physician discussed the case with neurologist who recommended obtaining MRI brain.   MRI brain did not show any acute intracranial abnormality.  It showed 1. Chronic posterior right MCA distribution infarct, 2. Small remote right cerebellar infarct. 4. Moderately advanced temporal lobe predominant cerebral atrophy with chronic microvascular ischemic disease. 5. Prominent ventriculomegaly. Patient was kept in observation under hospitalist service.  Subjective: Patient was seen and examined this morning.  Pleasant elderly African-American female.  Lying on bed.  Baseline dementia.  Unable to verbalize but not oriented. Later this morning, patient's son visited her.  He states he is  doing better and is starting to eat with him.  He is agreeable to getting a discharge back to facility today. Chart reviewed.  No fever  Assessment/Plan: Acute metabolic encephalopathy likely due to dehydration Underlying Alzheimer's dementia -Sent from an assisted living facility for low-grade fever, worsening weakness and confusion. -No acute abnormality identified in the initial work-up except for dehydration as shown by elevated sodium level and lactic acid level. -Per family, patient seems to have a gradual decline over the course of weeks to months.  MRI of the brain did not show any acute finding but showed significant chronic microvascular ischemic changes, remote infarcts and ventriculomegaly.   -TSH normal, vitamin B12 level normal, ammonia level normal -Patient has no history of seizure disorder and her presentation does not suggest any underlying seizure.  I do not think an EEG would be indicated. -She was given adequate IV hydration with subsequent improvement in sodium and lactic acid level.  Encourage oral hydration at home.  Resume home medications for dementia which include Depakote, Aricept and Zoloft.  Hypernatremia -Sodium level was mildly elevated to 147, likely due to dehydration.  Improved to normal with IV fluid Recent Labs  Lab 08/05/20 1509 08/05/20 1524 08/06/20 0545  NA 146* 147* 143   Essential hypertension -On amlodipine 5 mg daily, losartan 1 mg daily -Continue the same.  Thrombocytopenia -No clinical evidence of bleeding  Stable for discharge back to ALF today.   Wound care: Incision 10/31/11 Leg Left (Active)  Date First Assessed/Time First Assessed: 10/31/11 1058   Location: Leg  Location Orientation: Left    Assessments 10/31/2011 12:09 PM 11/03/2011  7:50 AM  Dressing Type Compression wrap Compression wrap  Dressing Clean;Dry;Intact Clean;Dry;Intact  Site / Wound Assessment Clean;Dry --  Drainage Amount None --  Treatment Ice applied --      No Linked orders to display    Discharge Exam:   Vitals:   08/06/20 0200 08/06/20 0500 08/06/20 0817 08/06/20 1040  BP: (!) 154/61 (!) 159/78 (!) 139/121   Pulse: (!) 59 78 68   Resp: 16 16 14    Temp:   97.7 F (36.5 C)   TempSrc:   Axillary   SpO2: 97% 99% 100%   Weight:    61.2 kg  Height:    5\' 4"  (1.626 m)    Body mass index is 23.17 kg/m.  General exam: Pleasant, elderly African-American female.  Not in distress Skin: No rashes, lesions or ulcers. HEENT: Atraumatic, normocephalic, no obvious bleeding Lungs: Clear to auscultation bilaterally CVS: Regular rate and rhythm, no murmur GI/Abd soft, nontender, nondistended, bowel sound present CNS: Alert, awake, demented at baseline.  No change in mental status compared to baseline Psychiatry: Mood appropriate Extremities: No pedal edema, no calf tenderness  Follow ups:   Discharge Instructions    Diet general   Complete by: As directed    Increase activity slowly   Complete by: As directed       Follow-up [place] Follow up.   Contact information: [address] [place] [address] [phone]              Recommendations for Outpatient Follow-Up:   1. Follow-up with PCP as an outpatient  Discharge Instructions:  Follow with Primary MD Patient, [name] in 7 days   Get CBC/BMP checked in next visit within 1 week by PCP or SNF MD ( we routinely change or add medications that can affect your baseline labs and fluid status, therefore we recommend that you get the mentioned basic workup next visit with your PCP, your PCP may decide not to get them or add new tests based on their clinical decision)  On your next visit with your PCP, please Get Medicines reviewed and adjusted.  Please request your PCP  to go over all Hospital Tests and Procedure/Radiological results at the follow up, please get all Hospital records sent to your Prim MD  by signing hospital release before you go home.  Activity: As tolerated with Full fall precautions use walker/cane & assistance as needed  For Heart failure patients - Check your Weight same time everyday, if you gain over 2 pounds, or you develop in leg swelling, experience more shortness of breath or chest pain, call your Primary MD immediately. Follow Cardiac Low Salt Diet and 1.5 lit/day fluid restriction.  If you have smoked or chewed Tobacco in the last 2 yrs please stop smoking, stop any regular Alcohol  and or any Recreational drug use.  If you experience worsening of your admission symptoms, develop shortness of breath, life threatening emergency, suicidal or homicidal thoughts you must seek medical attention immediately by calling 911 or calling your MD immediately  if symptoms less severe.  You Must read complete instructions/literature along with all the possible adverse reactions/side effects for all the Medicines you take and that have been prescribed to you. Take any new Medicines after you have completely understood and accpet all the possible adverse reactions/side effects.   Do not drive, operate heavy machinery, perform activities at heights, swimming or participation in water activities or provide baby sitting services if your were admitted for syncope or siezures until you have seen by Primary MD  or a Neurologist and advised to do so again.  Do not drive when taking Pain medications.  Do not take more than prescribed Pain, Sleep and Anxiety Medications  Wear Seat belts while driving.   Please note You were cared for by a hospitalist during your hospital stay. If you have any questions about your discharge medications or the care you received while you were in the hospital after you are discharged, you can call the unit and asked to speak with the hospitalist on call if the hospitalist that took care of you is not available. Once you are discharged, your primary care physician  will handle any further medical issues. Please note that NO REFILLS for any discharge medications will be authorized once you are discharged, as it is imperative that you return to your primary care physician (or establish a relationship with a primary care physician if you do not have one) for your aftercare needs so that they can reassess your need for medications and monitor your lab values.    Allergies as of 08/06/2020      Reactions   Omeprazole Other (See Comments)   "did me worse than it did good". Pt does not like this medication to treat her stomach symtoms      Medication List    TAKE these medications   acetaminophen 500 MG tablet Commonly known as: TYLENOL Take 500 mg by mouth every 4 (four) hours as needed for headache, mild pain or fever.   alum & mag hydroxide-simeth 200-200-20 MG/5ML suspension Commonly known as: MAALOX/MYLANTA Take 30 mLs by mouth daily as needed for indigestion or heartburn.   amLODipine 5 MG tablet Commonly known as: NORVASC Take 5 mg by mouth daily.   divalproex 125 MG capsule Commonly known as: DEPAKOTE SPRINKLE Take 125 mg by mouth 2 (two) times daily.   donepezil 10 MG tablet Commonly known as: ARICEPT Take 10 mg by mouth daily. Evening   guaifenesin 100 MG/5ML syrup Commonly known as: ROBITUSSIN Take 200 mg by mouth 4 (four) times daily as needed for cough.   loperamide 2 MG capsule Commonly known as: IMODIUM Take 2 mg by mouth daily as needed for diarrhea or loose stools.   losartan 100 MG tablet Commonly known as: COZAAR Take 100 mg by mouth daily.   magnesium hydroxide 400 MG/5ML suspension Commonly known as: MILK OF MAGNESIA Take 30 mLs by mouth at bedtime as needed for mild constipation.   rivastigmine 4.6 mg/24hr Commonly known as: Exelon Place 1 patch (4.6 mg total) onto the skin daily.   sertraline 25 MG tablet Commonly known as: ZOLOFT Take 25 mg by mouth daily.   THERA-M MULTIPLE VITAMINS PO Take 1 tablet by  mouth daily.   Triple Antibiotic First Aid [phone] Oint Apply 1 application topically daily as needed (minor skin tears or abrasions).   Vitamin D 50 MCG (2000 UT) Caps Take 2,000 Units by mouth daily.       Time coordinating discharge: 35 minutes  The results of significant diagnostics from this hospitalization (including imaging, microbiology, ancillary and laboratory) are listed below for reference.    Procedures and Diagnostic Studies:   CT Head Wo Contrast  Result Date: 08/05/2020 CLINICAL DATA:  Altered mental status EXAM: CT HEAD WITHOUT CONTRAST TECHNIQUE: Contiguous axial images were obtained from the base of the skull through the vertex without intravenous contrast. COMPARISON:  September 12, 2014 FINDINGS: Brain: Moderate diffuse atrophy is again noted. There is no intracranial mass, hemorrhage, extra-axial fluid  collection, or midline shift. There is a focal area of decreased attenuation in the anterior superior to mid right parietal lobe, not present on prior study. This decreased attenuation involves foci of gray and white matter in this area. Elsewhere, there is decreased attenuation throughout portions of the centra semiovale bilaterally. There are foci of basal ganglia calcification, likely physiologic. Vascular: No hyperdense vessels. There is calcification in each carotid siphon region. Skull: The bony calvarium appears intact. Sinuses/Orbits: Visualized paranasal sinuses are clear. Orbits appear symmetric bilaterally. Other: Mastoid air cells are clear. There is debris in each external auditory canal. IMPRESSION: 1. Focus of decreased attenuation in the anterior to mid right parietal lobe, concerning for recent and potentially acute infarct. 2.  Atrophy with periventricular small vessel disease. 3.  No mass or hemorrhage. 4.  Foci of arterial vascular calcification noted. 5.  Probable cerumen in each external auditory canal. Electronically Signed   By: Lowella Grip III  M.D.   On: 08/05/2020 16:27   MR BRAIN WO CONTRAST  Result Date: 08/05/2020 CLINICAL DATA:  Initial evaluation for acute mental status change, possible stroke on prior CT. EXAM: MRI HEAD WITHOUT CONTRAST TECHNIQUE: Multiplanar, multiecho pulse sequences of the brain and surrounding structures were obtained without intravenous contrast. COMPARISON:  Prior CT from 09/12/2014. FINDINGS: Brain: Diffuse prominence of the CSF containing spaces compatible with generalized cerebral atrophy, most pronounced at the anterior temporal lobes bilaterally, and overall moderate in nature. Patchy and confluent T2/FLAIR hyperintensity within the periventricular and deep white matter both cerebral hemispheres most consistent with chronic small vessel ischemic disease, moderate to advanced in nature. Area of encephalomalacia and gliosis at the right parietal lobe consistent with a chronic posterior right MCA distribution infarct, corresponding with hypodensity on prior CT. Associated susceptibility artifact consistent with chronic blood product and/or laminar necrosis. Additional small remote right cerebellar infarct noted. No abnormal foci of restricted diffusion to suggest acute or subacute ischemia. Gray-white matter differentiation otherwise maintained. No other evidence for acute or chronic intracranial hemorrhage. No mass lesion, midline shift or mass effect. Diffuse ventricular prominence noted. While this may be solely related to global parenchymal volume loss, a degree of NPH could be contributory. Pituitary gland suprasellar region within normal limits. Midline structures intact. Vascular: Major intracranial vascular flow voids are maintained. Skull and upper cervical spine: Craniocervical junction within normal limits. Bone marrow signal intensity normal. No scalp soft tissue abnormality. Sinuses/Orbits: Globes and orbital soft tissues within normal limits. Paranasal sinuses are clear. No significant mastoid effusion.  Other: None. IMPRESSION: 1. No acute intracranial abnormality. 2. Chronic posterior right MCA distribution infarct, corresponding with previously noted hyperdensity on prior CT. 3. Additional small remote right cerebellar infarct. 4. Moderately advanced temporal lobe predominant cerebral atrophy with chronic microvascular ischemic disease. 5. Prominent ventriculomegaly. While this finding may solely be related to global parenchymal volume loss, a degree of NPH may be contributory, and could be considered in the correct clinical setting. Electronically Signed   By: Jeannine Boga M.D.   On: 08/05/2020 23:32   DG Chest Portable 1 View  Result Date: 08/05/2020 CLINICAL DATA:  Altered mental status EXAM: PORTABLE CHEST 1 VIEW COMPARISON:  December 17, 2014 FINDINGS: There is mild left base atelectasis. Lungs elsewhere are clear. Heart is upper normal in size with pulmonary vascularity normal. No adenopathy. There is arthropathy in the right shoulder. IMPRESSION: Mild left base atelectasis. No edema or airspace opacity. Heart upper normal in size. Electronically Signed   By: Lowella Grip  III M.D.   On: 08/05/2020 15:16     Labs:   Basic Metabolic Panel: Recent Labs  Lab 08/05/20 1509 08/05/20 1524 08/06/20 0545  NA 146* 147* 143  K 3.6 4.0 3.2*  CL 110 108 111  CO2 26  --  23  GLUCOSE 105* 107* 86  BUN 27* 31* 17  CREATININE 0.90 0.90 0.60  CALCIUM 8.8*  --  8.0*  MG 2.1  --  2.0   GFR Estimated Creatinine Clearance: 45.2 mL/min (by C-G formula based on SCr of 0.6 mg/dL). Liver Function Tests: Recent Labs  Lab 08/05/20 1509 08/06/20 0545  AST 29 44*  ALT 15 18  ALKPHOS 80 63  BILITOT 0.5 0.1*  PROT 6.9 5.5*  ALBUMIN 3.4* 2.7*   No results for input(s): LIPASE, AMYLASE in the last 168 hours. Recent Labs  Lab 08/06/20 0545  AMMONIA 10   Coagulation profile No results for input(s): INR, PROTIME in the last 168 hours.  CBC: Recent Labs  Lab 08/05/20 1509  08/05/20 1524  WBC 10.7*  --   NEUTROABS 9.0*  --   HGB 11.0* 11.9*  HCT 35.3* 35.0*  MCV 104.7*  --   PLT 94*  --    Cardiac Enzymes: No results for input(s): CKTOTAL, CKMB, CKMBINDEX, TROPONINI in the last 168 hours. BNP: Invalid input(s): POCBNP CBG: Recent Labs  Lab 08/05/20 1439  GLUCAP 105*   D-Dimer No results for input(s): DDIMER in the last 72 hours. Hgb A1c No results for input(s): HGBA1C in the last 72 hours. Lipid Profile No results for input(s): CHOL, HDL, LDLCALC, TRIG, CHOLHDL, LDLDIRECT in the last 72 hours. Thyroid function studies Recent Labs    08/05/20 2300  TSH 2.740   Anemia work up Recent Labs    08/06/20 [place] 550   Microbiology Recent Results (from the past 240 hour(s))  Culture, blood (routine x 2)     Status: None (Preliminary result)   Collection Time: 08/05/20  3:33 PM   Specimen: BLOOD LEFT ARM  Result Value Ref Range Status   Specimen Description   Final    BLOOD LEFT ARM Performed at [facility], [place] [address] [place], [place] [address]    Special Requests   Final    BOTTLES DRAWN AEROBIC AND ANAEROBIC Blood Culture results may not be optimal due to an excessive volume of blood received in culture bottles Performed at [facility] [address] [place], [place] [address]    Culture   Final    NO GROWTH < 12 HOURS Performed at [facility] [address], [place], [place] [address]    Report Status PENDING  Incomplete  Resp Panel by RT-PCR (Flu A&B, Covid) Nasopharyngeal Swab     Status: None   Collection Time: 08/05/20  3:33 PM   Specimen: Nasopharyngeal Swab; Nasopharyngeal(NP) swabs in vial transport medium  Result Value Ref Range Status   SARS Coronavirus 2 by RT PCR NEGATIVE NEGATIVE Final    Comment: (NOTE) SARS-CoV-2 target nucleic acids are NOT DETECTED.  The SARS-CoV-2 RNA is generally detectable in upper respiratory specimens during the acute phase of  infection. The lowest concentration of SARS-CoV-2 viral copies this assay can detect is 138 copies/mL. A negative result does not preclude SARS-Cov-2 infection and should not be used as the sole basis for treatment or other patient management decisions. A negative result may occur with  improper specimen collection/handling, submission of specimen other than nasopharyngeal  swab, presence of viral mutation(s) within the areas targeted by this assay, and inadequate number of viral copies(<138 copies/mL). A negative result must be combined with clinical observations, patient history, and epidemiological information. The expected result is Negative.  Fact Sheet for Patients:  EntrepreneurPulse.com.au  Fact Sheet for Healthcare Providers:  IncredibleEmployment.be  This test is no t yet approved or cleared by the [place] FDA and  has been authorized for detection and/or diagnosis of SARS-CoV-2 by FDA under an Emergency Use Authorization (EUA). This EUA will remain  in effect (meaning this test can be used) for the duration of the COVID-19 declaration under Section 564(b)(1) of the Act, 21 U.S.C.section 360bbb-3(b)(1), unless the authorization is terminated  or revoked sooner.       Influenza A by PCR NEGATIVE NEGATIVE Final   Influenza B by PCR NEGATIVE NEGATIVE Final    Comment: (NOTE) The Xpert Xpress SARS-CoV-2/FLU/RSV plus assay is intended as an aid in the diagnosis of influenza from Nasopharyngeal swab specimens and should not be used as a sole basis for treatment. Nasal washings and aspirates are unacceptable for Xpert Xpress SARS-CoV-2/FLU/RSV testing.  Fact Sheet for Patients: EntrepreneurPulse.com.au  Fact Sheet for Healthcare Providers: IncredibleEmployment.be  This test is not yet approved or cleared by the [place] FDA and has been authorized for detection and/or diagnosis of SARS-CoV-2  by FDA under an Emergency Use Authorization (EUA). This EUA will remain in effect (meaning this test can be used) for the duration of the COVID-19 declaration under Section 564(b)(1) of the Act, 21 U.S.C. section 360bbb-3(b)(1), unless the authorization is terminated or revoked.  Performed at [facility], [place] [address] [place], [place] [address]   Culture, blood (routine x 2)     Status: None (Preliminary result)   Collection Time: 08/05/20  3:34 PM   Specimen: BLOOD RIGHT ARM  Result Value Ref Range Status   Specimen Description   Final    BLOOD RIGHT ARM Performed at [facility] [address] [place], [place] [address]    Special Requests   Final    BOTTLES DRAWN AEROBIC AND ANAEROBIC Blood Culture results may not be optimal due to an excessive volume of blood received in culture bottles Performed at [facility] [address] [place], [place] [address]    Culture   Final    NO GROWTH < 12 HOURS Performed at [facility] [address], [place],  [address]    Report Status PENDING  Incomplete     Signed: Marlowe Aschoff Nyeisha Goodall  [facility] 08/06/2020, 11:07 AM

## 2020-08-06 NOTE — ED Notes (Signed)
Patient son in the room

## 2020-08-06 NOTE — Progress Notes (Signed)
..   Transition of Care [facility]) - Emergency Department Mini Assessment   Patient Details  Name: Wanda Conner MRN: [id number] Date of Birth: 09-Nov-1933  Transition of Care [facility]) CM/SW Contact:    Gaetano Hawthorne Tarpley-Carter, [place] Phone Number: 08/06/2020, 12:09 PM   Clinical Narrative: [facility] CM/CSW contacted [facility] to facilitate pts safe transport return to facility.  CSW has provided Levada Dy, Therapist, sports with needed information to complete safe dc return to [facility].  [name], MSW, LCSW-A Pronouns:  She, Her, Hers                  [name] ED Transitions of CareClinical Social Worker [email or web address] [phone]   ED Mini Assessment: What brought you to the Emergency Department? : Altered Mental Status  Barriers to Discharge: No Barriers Identified     Means of departure: Ambulance  Interventions which prevented an admission or readmission: Transportation Screening    Patient Contact and Communications Key Contact 1: [place] with: Donella Stade, RN Contact Date: 08/06/20,     Contact Phone Number: [phone] Call outcome: Pt can return to facility.      Choice offered to / list presented to : NA  Admission diagnosis:  Acute metabolic encephalopathy [H74.16] Patient Active Problem List   Diagnosis Date Noted  . Hypernatremia 08/06/2020  . Alzheimer's dementia with behavioral disturbance ([place]) 08/06/2020  . Thrombocytopenia ([place]) 08/06/2020  . Lactic acidosis 08/06/2020  . Acute metabolic encephalopathy 38/45/3646  . Open wound of foot except toe(s) alone 11/20/2014  . Weight loss, unintentional 09/18/2014  . Uterine prolapse 10/25/2013  . Bony growth 12/12/2012  . Hypertension, essential, benign 11/21/2011  . Osteoarthritis of left knee 10/31/2011  . Preventative health care 12/29/2010  . Urge incontinence 12/29/2010   PCP:  Patient, [name] Pharmacy:   [facility] [address], [place]  - [place] [address] [place] [place] [address] Phone: [phone] Fax: [phone]

## 2020-08-07 LAB — URINE CULTURE: Culture: NO GROWTH

## 2020-08-10 LAB — CULTURE, BLOOD (ROUTINE X 2)
Culture: NO GROWTH
Culture: NO GROWTH

## 2020-10-08 ENCOUNTER — Other Ambulatory Visit: Payer: Self-pay

## 2020-10-08 ENCOUNTER — Emergency Department (HOSPITAL_COMMUNITY): Payer: Medicare Other

## 2020-10-08 ENCOUNTER — Observation Stay (HOSPITAL_COMMUNITY): Payer: Medicare Other

## 2020-10-08 ENCOUNTER — Observation Stay (HOSPITAL_COMMUNITY)
Admission: EM | Admit: 2020-10-08 | Discharge: 2020-10-09 | Disposition: A | Discharge status: Home / Self Care | Payer: Medicare Other | Attending: Infectious Diseases | Admitting: Infectious Diseases

## 2020-10-08 ENCOUNTER — Encounter (HOSPITAL_COMMUNITY): Payer: Self-pay

## 2020-10-08 DIAGNOSIS — F0280 Dementia in other diseases classified elsewhere without behavioral disturbance: Secondary | ICD-10-CM | POA: Diagnosis not present

## 2020-10-08 DIAGNOSIS — Z20822 Contact with and (suspected) exposure to covid-19: Secondary | ICD-10-CM | POA: Diagnosis not present

## 2020-10-08 DIAGNOSIS — R569 Unspecified convulsions: Secondary | ICD-10-CM | POA: Diagnosis present

## 2020-10-08 DIAGNOSIS — Z96652 Presence of left artificial knee joint: Secondary | ICD-10-CM | POA: Insufficient documentation

## 2020-10-08 DIAGNOSIS — G9340 Encephalopathy, unspecified: Secondary | ICD-10-CM | POA: Diagnosis not present

## 2020-10-08 DIAGNOSIS — Z79899 Other long term (current) drug therapy: Secondary | ICD-10-CM | POA: Diagnosis not present

## 2020-10-08 DIAGNOSIS — E441 Mild protein-calorie malnutrition: Secondary | ICD-10-CM | POA: Diagnosis not present

## 2020-10-08 DIAGNOSIS — F0390 Unspecified dementia without behavioral disturbance: Secondary | ICD-10-CM

## 2020-10-08 DIAGNOSIS — I10 Essential (primary) hypertension: Secondary | ICD-10-CM | POA: Insufficient documentation

## 2020-10-08 DIAGNOSIS — D509 Iron deficiency anemia, unspecified: Secondary | ICD-10-CM | POA: Insufficient documentation

## 2020-10-08 DIAGNOSIS — G309 Alzheimer's disease, unspecified: Secondary | ICD-10-CM | POA: Insufficient documentation

## 2020-10-08 LAB — VITAMIN B12: Vitamin B-12: 437 pg/mL (ref 180–914)

## 2020-10-08 LAB — URINALYSIS, ROUTINE W REFLEX MICROSCOPIC
Bilirubin Urine: NEGATIVE
Glucose, UA: NEGATIVE mg/dL
Hgb urine dipstick: NEGATIVE
Ketones, ur: NEGATIVE mg/dL
Nitrite: NEGATIVE
Protein, ur: NEGATIVE mg/dL
Specific Gravity, Urine: 1.01 (ref 1.005–1.030)
pH: 7 (ref 5.0–8.0)

## 2020-10-08 LAB — BASIC METABOLIC PANEL
Anion gap: 10 (ref 5–15)
BUN: 12 mg/dL (ref 8–23)
CO2: 24 mmol/L (ref 22–32)
Calcium: 8.4 mg/dL — ABNORMAL LOW (ref 8.9–10.3)
Chloride: 106 mmol/L (ref 98–111)
Creatinine, Ser: 0.73 mg/dL (ref 0.44–1.00)
GFR, Estimated: >60 mL/min (ref >60)
Glucose, Bld: 124 mg/dL — ABNORMAL HIGH (ref 70–99)
Potassium: 3.8 mmol/L (ref 3.5–5.1)
Sodium: 140 mmol/L (ref 135–145)

## 2020-10-08 LAB — CBG MONITORING, ED: Glucose-Capillary: 105 mg/dL — ABNORMAL HIGH (ref 70–99)

## 2020-10-08 LAB — CBC
HCT: 36.4 % (ref 36.0–46.0)
Hemoglobin: 11.6 g/dL — ABNORMAL LOW (ref 12.0–15.0)
MCH: 32.4 pg (ref 26.0–34.0)
MCHC: 31.9 g/dL (ref 30.0–36.0)
MCV: 101.7 fL — ABNORMAL HIGH (ref 80.0–100.0)
Platelets: 231 10*3/uL (ref 150–400)
RBC: 3.58 MIL/uL — ABNORMAL LOW (ref 3.87–5.11)
RDW: 14.1 % (ref 11.5–15.5)
WBC: 7.3 10*3/uL (ref 4.0–10.5)
nRBC: 0 % (ref 0.0–0.2)

## 2020-10-08 LAB — FOLATE: Folate: 12.9 ng/mL (ref >5.9)

## 2020-10-08 LAB — HEPATIC FUNCTION PANEL
ALT: 14 U/L (ref 0–44)
AST: 18 U/L (ref 15–41)
Albumin: 2.8 g/dL — ABNORMAL LOW (ref 3.5–5.0)
Alkaline Phosphatase: 74 U/L (ref 38–126)
Bilirubin, Direct: <0.1 mg/dL (ref 0.0–0.2)
Total Bilirubin: 0.1 mg/dL — ABNORMAL LOW (ref 0.3–1.2)
Total Protein: 6.2 g/dL — ABNORMAL LOW (ref 6.5–8.1)

## 2020-10-08 LAB — MAGNESIUM: Magnesium: 2 mg/dL (ref 1.7–2.4)

## 2020-10-08 LAB — SARS CORONAVIRUS 2 (TAT 6-24 HRS): SARS Coronavirus 2: NEGATIVE

## 2020-10-08 LAB — VALPROIC ACID LEVEL: Valproic Acid Lvl: 42 ug/mL — ABNORMAL LOW (ref 50.0–100.0)

## 2020-10-08 LAB — TSH: TSH: 3.861 u[IU]/mL (ref 0.350–4.500)

## 2020-10-08 MED ORDER — ACETAMINOPHEN 650 MG RE SUPP: 650.0000 mg | Freq: Four times a day (QID) | RECTAL | Status: DC | PRN

## 2020-10-08 MED ORDER — ACETAMINOPHEN 325 MG PO TABS: 650.0000 mg | ORAL_TABLET | Freq: Four times a day (QID) | ORAL | Status: DC | PRN

## 2020-10-08 MED ORDER — VALPROATE SODIUM 100 MG/ML IV SOLN
250.0000 mg | Freq: Two times a day (BID) | INTRAVENOUS | Status: DC
  Administered 2020-10-09: 250 mg via INTRAVENOUS
  Filled 2020-10-08 (×5): qty 2.5

## 2020-10-08 MED ORDER — VALPROATE SODIUM 100 MG/ML IV SOLN
500.0000 mg | Freq: Two times a day (BID) | INTRAVENOUS | Status: DC
  Filled 2020-10-08 (×3): qty 5

## 2020-10-08 MED ORDER — LEVETIRACETAM IN NACL 1000 MG/100ML IV SOLN
1000.0000 mg | Freq: Once | INTRAVENOUS | Status: AC
  Administered 2020-10-08: 1000 mg via INTRAVENOUS
  Filled 2020-10-08: qty 100

## 2020-10-08 MED ORDER — VALPROATE SODIUM 100 MG/ML IV SOLN
1200.0000 mg | Freq: Once | INTRAVENOUS | Status: AC
  Administered 2020-10-08: 1200 mg via INTRAVENOUS
  Filled 2020-10-08: qty 12

## 2020-10-08 MED ORDER — LEVETIRACETAM IN NACL 500 MG/100ML IV SOLN: 500.0000 mg | Freq: Two times a day (BID) | INTRAVENOUS | Status: DC

## 2020-10-08 MED ORDER — ENOXAPARIN SODIUM 40 MG/0.4ML IJ SOSY
40.0000 mg | PREFILLED_SYRINGE | INTRAMUSCULAR | Status: DC
  Administered 2020-10-08 – 2020-10-09 (×2): 40 mg via SUBCUTANEOUS
  Filled 2020-10-08 (×2): qty 0.4

## 2020-10-08 NOTE — Procedures (Signed)
Patient Name: Wanda Conner  MRN: [id number]  Epilepsy Attending: Lora Havens  Referring Physician/Provider: Dr Harvie Heck Date: 10/08/2020 Duration: 26.27 mins  Patient history: 87 year old female who presented with seizure-like activity.  EEG to evaluate for seizures.  Level of alertness: Awake, asleep  AEDs during EEG study: VPA, LEV  Technical aspects: This EEG study was done with scalp electrodes positioned according to the 10-20 International system of electrode placement. Electrical activity was acquired at a sampling rate of 500Hz  and reviewed with a high frequency filter of 70Hz  and a low frequency filter of 1Hz . EEG data were recorded continuously and digitally stored.   Description: No clear posterior dominant rhythm was seen. Sleep was characterized by sleep spindles (12 to 14 Hz) maximal frontocentral region.  EEG showed continuous generalized 3 to 5 Hz theta-delta slowing. Photic driving was not seen during photic stimulation.  Hyperventilation was not performed.     ABNORMALITY - Continuous slow, generalized  IMPRESSION: This study is suggestive of moderate diffuse encephalopathy, nonspecific etiology. No seizures or epileptiform discharges were seen throughout the recording.  [name] Barbra Sarks

## 2020-10-08 NOTE — ED Notes (Signed)
Patient transported to CT 

## 2020-10-08 NOTE — Hospital Course (Addendum)
5/20: This morning patient does not report any complaints. Does withdrawal to cold touch.

## 2020-10-08 NOTE — ED Notes (Signed)
Pt in MRI.

## 2020-10-08 NOTE — ED Notes (Addendum)
Called to give report. RN currently with a patient. Will call back.

## 2020-10-08 NOTE — ED Notes (Signed)
Help get patient undressed on the monitor did ekg shown to er provider patient is resting with nurse at bedside

## 2020-10-08 NOTE — ED Notes (Signed)
Pt returned from MRI °

## 2020-10-08 NOTE — H&P (Addendum)
Date: 10/08/2020               Patient Name:  Wanda Conner MRN: [id number]  DOB: 1933/06/05 Age / Sex: 87 y.o., female   PCP: Patient, [name] (Inactive)              Medical Service: Internal Medicine Teaching Service              Attending Physician: Dr. Johnnye Sima, Doroteo Bradford, MD    First Contact: Mikael Spray, MS4 Pager: [phone]  Second Contact: Dr. Jeralyn Bennett  Pager: [phone]  Third Contact Dr. Harvie Heck Pager: [phone]       After Hours (After 5p/  First Contact Pager: [phone]  weekends / holidays): Second Contact Pager: [phone]    Chief Complaint: Seizures  History of Present Illness:  Wanda Conner is a 87 year old female with a history of Alzheimer's dementia, HTN, HLD, and hydrocephalous who presented to [facility] ED from her memory unit after a witnessed seizure that lasted approximately 2 minutes. EMS was then called and she had an additional 2 seizures in transit to the ED that required 2.5 mg of versed. No further seizure activity was noted after arrival to the ED. She has no history of seizures prior to this.  At baseline patient is sometimes somnolent and lethargic. He states that she is currently at he baseline, though she can intermittently be more active and alert .  History obtained by son at the bedside. He reports that Ms Wanda Conner has had progressive dementia and, most recently, has stopped being able to recognize him. She is still able to recognize his wife at times but not grandchildren. She is immobile and does not talk much at baseline. He was made aware earlier today that she had a seizure at the facility. No prior personal or family history of seizures. She apparently had a "mini stroke" several months ago. No recent illness. Few months ago, when he visited with her, he noticed her to be "shaking" and was concerned for infection. This was in both sides of her body.  No known history of thyroid problems. Dementia has gotten progressively  worse, more rapidly, over the past year.    ED course:  Initial labs showed CBG of 105. BMP with sodium 140 and calcium of 8.4, but was otherwise unremarkable. CBC with  Hgb decreased to 11.6 with increased MCV, which appears chronic.  CT head w/o contrast showed no acute intracranial abnormality and stable hydrocephalous compared to MRI 2 months ago.  She was loaded with keppra and neurology was consulted.    Meds: No new medications. Current Meds  Medication Sig  . acetaminophen (TYLENOL) 500 MG tablet Take 500 mg by mouth every 4 (four) hours as needed for headache, mild pain or fever (CANNOT EXCEED 2,000 MG/24 HOURS).  Marland Kitchen amLODipine (NORVASC) 5 MG tablet Take 5 mg by mouth daily.  . Cholecalciferol (VITAMIN D3 SUPER STRENGTH) 50 MCG (2000 UT) CAPS Take 2,000 Units by mouth daily.  . divalproex (DEPAKOTE SPRINKLE) 125 MG capsule Take 125 mg by mouth 2 (two) times daily.  Marland Kitchen donepezil (ARICEPT) 10 MG tablet Take 10 mg by mouth every evening.  Marland Kitchen guaifenesin (ROBITUSSIN) 100 MG/5ML syrup Take 200 mg by mouth every 6 (six) hours as needed for cough.  . loperamide (IMODIUM) 2 MG capsule Take 2 mg by mouth as needed (for diarrhea with each loose stool- MAX OF 8 DOSES/24 HOURS).  Marland Kitchen losartan (COZAAR) 100  MG tablet Take 100 mg by mouth daily.  . magnesium hydroxide (MILK OF MAGNESIA) 400 MG/5ML suspension Take 30 mLs by mouth at bedtime as needed for mild constipation.  Marland Kitchen [place] 098-119-14 MG/5ML suspension Take 30 mLs by mouth every 6 (six) hours as needed for indigestion or heartburn (CANNOT EXCEED 4 DOSES/24 HOURS).  . Neomycin-Bacitracin-Polymyxin (TRIPLE ANTIBIOTIC FIRST AID) [phone] OINT Apply 1 application topically daily as needed (minor skin tears or abrasions- clean with normal saline first and bandage afterwards).  . sertraline (ZOLOFT) 25 MG tablet Take 25 mg by mouth daily.     Allergies: Allergies as of 10/08/2020 - Review Complete 10/08/2020  Allergen Reaction Noted  .  Omeprazole Other (See Comments) 10/18/2011   Past Medical History:  Diagnosis Date  . Agitation 09/02/2014  . Anemia    History of this  . Arthritis   . Dementia with behavioral disturbance ([place]) 08/21/2014  . Depression   . Elevated vitamin B12 level 09/02/2014  . GERD (gastroesophageal reflux disease)   . Hypertension, essential, benign 11/21/2011  . Osteoarthritis of left knee 10/31/2011   Status post knee replacement on 10/31/2011.   Marland Kitchen Palpitations 12/12/2012  . Personal history of peptic ulcer disease 11/12/2009   Qualifier: Diagnosis of  By: Mingo Amber MD, Merry Proud    . Seasonal allergies   . Urge incontinence 12/29/2010  . Uterine prolapse 10/25/2013  . Weight loss, unintentional 09/18/2014    Family History:  Family History  Problem Relation Age of Onset  . Diabetes Father   . Diabetes Brother    No significant family history of cancer.  No family history of seizure disorders.    Social History:  Patient lives in a memory care facility. She has been here for 4 years; she was living in another memory care facility prior to this.  She is completely dependent for her ADL's. She is bed bound and at most, will transfer to wheel chair.  Husband passed away in 09-08-2014 after >50 years of marriage; her dementia progressed more following this.   Review of Systems: A complete ROS could not be performed due to altered mental status.   Physical Exam: Blood pressure (!) 134/59, pulse (!) 55, temperature 97.8 F (36.6 C), temperature source Axillary, resp. rate 14, SpO2 100 %.  Physical Exam Constitutional:      Appearance: She is cachectic.     Comments: Frail, thin elderly woman lying in bed on her side. Agitated and non verbal.  HENT:     Head: Normocephalic and atraumatic. No Battle's sign.     Comments: Dark circles surrounding eyes Cardiovascular:     Rate and Rhythm: Normal rate and regular rhythm.     Heart sounds: Normal heart sounds.  Pulmonary:     Effort: Pulmonary effort is  normal.     Breath sounds: Normal breath sounds.  Abdominal:     General: Abdomen is flat. There is no distension.     Palpations: Abdomen is soft.     Tenderness: There is no abdominal tenderness.  Musculoskeletal:     Right lower leg: No edema.     Left lower leg: No edema.  Skin:    General: Skin is warm and dry.     Capillary Refill: Capillary refill takes less than 2 seconds.  Neurological:     Mental Status: Mental status is at baseline. She is lethargic and disoriented.     Comments: Majority of the neuro exam could not be assessed due to patient's AMS  and agitation. Patient does appear to be moving all extremities spontaneously against gravity. Face appear symmetrical. Withdraws extremities to pain and open eyes to voice. Patient would not participate in other portions of exam.   Psychiatric:        Attention and Perception: She is inattentive.        Mood and Affect: Affect is flat.        Speech: She is noncommunicative.        Behavior: Behavior is uncooperative, agitated, slowed and withdrawn.     EKG: personally reviewed my interpretation is sinus rhythm RBBB, no evidence of acute ischemia unchanged from prior   CBC Latest Ref Rng & Units 10/08/2020 08/05/2020 08/05/2020  WBC 4.0 - 10.5 K/uL 7.3 - 10.7(H)  Hemoglobin 12.0 - 15.0 g/dL 11.6(L) 11.9(L) 11.0(L)  Hematocrit 36.0 - 46.0 % 36.4 35.0(L) 35.3(L)  Platelets 150 - 400 K/uL 231 - 94(L)   Component Ref Range & Units 11:04 2 mo ago  Glucose-Capillary 70 - 99 mg/dL 105High  105High    CMP Latest Ref Rng & Units 10/08/2020 08/06/2020 08/05/2020  Glucose 70 - 99 mg/dL 124(H) 86 107(H)  BUN 8 - 23 mg/dL 12 17 31(H)  Creatinine 0.44 - 1.00 mg/dL 0.73 0.60 0.90  Sodium 135 - 145 mmol/L 140 143 147(H)  Potassium 3.5 - 5.1 mmol/L 3.8 3.2(L) 4.0  Chloride 98 - 111 mmol/L 106 111 108  CO2 22 - 32 mmol/L 24 23 -  Calcium 8.9 - 10.3 mg/dL 8.4(L) 8.0(L) -  Total Protein 6.5 - 8.1 g/dL 6.2(L) 5.5(L) -  Total Bilirubin 0.3  - 1.2 mg/dL 0.1(L) 0.1(L) -  Alkaline Phos 38 - 126 U/L 74 63 -  AST 15 - 41 U/L 18 44(H) -  ALT 0 - 44 U/L 14 18 -   Component Ref Range & Units 11:06  (10/08/20) 2 mo ago  (08/06/20) 2 mo ago  (08/05/20)  Magnesium 1.7 - 2.4 mg/dL 2.0  2.0 CM  2.1 CM    Component Ref Range & Units 13:48 2 mo ago 6 yr ago 7 yr ago  TSH 0.350 - 4.500 uIU/mL 3.861  2.740 CM  1.175  1.188    Component Ref Range & Units 13:48 2 mo ago 6 yr ago  Vitamin B-12 180 - 914 pg/mL 437  550 CM  1,813High R    Ref Range & Units 13:48  Folate >5.9 ng/mL 12.9    Urinalysis    Component Value Date/Time   COLORURINE YELLOW 10/08/2020 1357   APPEARANCEUR CLEAR 10/08/2020 1357   LABSPEC 1.010 10/08/2020 1357   PHURINE 7.0 10/08/2020 1357   GLUCOSEU NEGATIVE 10/08/2020 [place] 10/08/2020 [place] 10/08/2020 [place] 10/08/2020 1357   PROTEINUR NEGATIVE 10/08/2020 1357   UROBILINOGEN 0.2 10/25/2011 1057   NITRITE NEGATIVE 10/08/2020 1357   LEUKOCYTESUR TRACE (A) 10/08/2020 1357   CT head w/o contrast IMPRESSION: 1. No acute intracranial findings. 2. Hydrocephalus increased from CT 09/12/2014. No change from MRI 08/05/2020.  MRI brain w/o contrast IMPRESSION: 1. No acute intracranial abnormality. 2. Moderate chronic small vessel ischemic disease and advanced cerebral atrophy.  Spot EEG IMPRESSION: This study is suggestive of moderate diffuse encephalopathy, nonspecific etiology. No seizures or epileptiform discharges were seen throughout the recording.   Assessment & Plan by Problem: Active Problems:   Seizure [facility])  ANGEE GUPTON is a 87 year old female with a history of Alzheimer's dementia, HTN, HLD,  and hydrocephalous who is admitted for work up of new onset seizures.   Seizure Patient with new onset seizures. No evidence of acute intracranial abnormality on CT or MRI. Hydrocephalous appears increased compared to CT on 04/22, but stable  from MRI on 03/22. Normal glucose on presentation, no electrolyte imbalances, normal TSH , normal B12, and normal folate. No evidence of acute infection. Normal spot EEG. At this point leading cause of her presentation is progression of her hydrocephalous secondary to Alzheimer's dementia v due to chronic microvascular ischemia as noted on imaging. Though on chart review she was recently admitted for acute metabolic encephalopathy secondary to dehydration and not likely related to her current presentation.  - neurology consulted and following; appreciate their recommendations   - D/C keppra due to behavioral issues - will load with depakote for seizure prevention instead (500 mg IV BID) - stat valproic acid level prior to loading - ativan 1-2 mg IV q4h PRN for seizure longer than 3 min - neurology considering overnight EEG monitoring - monitor CBG - monitor CBC - monitor CMP - seizure precautions - SLP consult for swallow studies   Macrocytic anemia - chronic Hemoglobin at 11.6 and MCV 101.7. This appears chronic. B12 and folate normal.  - montitor CBC  Malnutrition - chronic Total protein 6.2 and albumin 2.8 with total bilirubin at 0.1.  - dietician consult pending swallow study  DVT ppx: lovenox Diet: NPO, pending swallow test  Dispo: Admit patient to Observation with expected length of stay less than 2 midnights.  Signed: Mikael Spray, Medical Student 10/08/2020, 4:11 PM  Pager: [phone]  Attestation for Student Documentation:  I personally was present and performed or re-performed the history, physical exam and medical decision-making activities of this service and have verified that the service and findings are accurately documented in the student's note.  Harvie Heck, MD  [facility] PGY-2 10/08/2020, 6:10 PM

## 2020-10-08 NOTE — Progress Notes (Signed)
EEG complete - results pending 

## 2020-10-08 NOTE — ED Triage Notes (Signed)
Pt from [place] for sz lasting about 2-3 minutes per facility. No hz of sz. Pt was seated in wheelchair when sz started and was lowered to the ground. No fall. Pt had 2 sz with ems. EMS gave 2.5mg  Versed IV. NRB placed. On arrival, pt not alert and only responsive to pain. Hx dementia and HTN.

## 2020-10-08 NOTE — Consult Note (Addendum)
Neurology Consultation  Reason for Consult: new onset seizures Referring Physician: Willette Cluster, MD.   CC: seizures  History is obtained from: chart, son.    HPI: Wanda Conner is a 87 y.o. female with PMHx of Alzheimer's dementia with behavioral disturbance living in a memory care unit,  HTN, GERD, and depression who presented to ED from her facility after having seizure like activity. She had two additional seizures with EMS. In ED, patient was lethargic and only responding to painful stimuli, but son at bedside stated that was the patient's baseline.  In the ED, her [facility] was negative for acute process but commented on increased hydrocephalus from CT in 2016. She was loaded with Keppra. Additional workup has shown normal Na++, Negative UA, and Glucose of 124.  In review of chart, MRI in 3/22, showed "no acute intracranial abnormality. Chronic posterior right MCA distribution infarct, corresponding with previously noted hyperdensity on prior CT. Additional small remote right cerebellar infarct. Moderately advanced temporal lobe predominant cerebral atrophy with chronic microvascular ischemic disease. Prominent ventriculomegaly. While this finding may solely be related to global parenchymal volume loss, a degree of NPH may be contributory, and could be considered in the correct clinical setting."  Further review shows an admission here on 08/05/20 for acute metabolic encephalopathy, low grade fever and worsening weakness and confusion. Only finding was dehydration. No noted seizure activity during that stay. There is an old PCP OV note from 2016 which states patient was still shopping with friend, talking, walking, etc. There are no further notes til 3/22. Given patient in a facility, she is seen by MD/NP not on Epic EMR.   Because of this seizure activity, neurology was asked to consult.   NP called son to illicit further history, and he was here. NP back to room to speak with son. Per son, he  was not present for event today. States the staff at facilility called him and said patient was having seizures. When NP asked if they related if she was shaking or jerking, etc., he replied, "well, common sense should tell you if they sent her here, they were probably bad". He thinks this is her first seizure. States patient has had mini strokes and NP informed him of her chronic infarcts on MRI. NP discussed hydrocephalus on last MRI and worsening of that on [facility] today and that we will get MRI brain for comparison. NP reviewed end of life wishes, and he was rude. He said he did not want anyone to play God, and he would take her when he was ready". NP reviewed statistics of likelihood of her being able to be revived, and he said, "well she is still breathing now".   EEG pending and patient to MRI now.   ROS: A robust ROS was not performed due to patient not being verbally responsive, following commands, or answering questions.    Past Medical History:  Diagnosis Date  . Agitation 09/02/2014  . Anemia    History of this  . Arthritis   . Dementia with behavioral disturbance ([place]) 08/21/2014  . Depression   . Elevated vitamin B12 level 09/02/2014  . GERD (gastroesophageal reflux disease)   . Hypertension, essential, benign 11/21/2011  . Osteoarthritis of left knee 10/31/2011   Status post knee replacement on 10/31/2011.   Marland Kitchen Palpitations 12/12/2012  . Personal history of peptic ulcer disease 11/12/2009   Qualifier: Diagnosis of  By: Mingo Amber MD, Merry Proud    . Seasonal allergies   . Urge incontinence  12/29/2010  . Uterine prolapse 10/25/2013  . Weight loss, unintentional 09/18/2014  Infarcts on MRI 3/22.   Family History  Problem Relation Age of Onset  . Diabetes Father   . Diabetes Brother    Social History:   reports that she has never smoked. She has never used smokeless tobacco. She reports that she does not drink alcohol and does not use drugs.  Medications No current facility-administered  medications for this encounter.  Current Outpatient Medications:  .  acetaminophen (TYLENOL) 500 MG tablet, Take 500 mg by mouth every 4 (four) hours as needed for headache, mild pain or fever (CANNOT EXCEED 2,000 MG/24 HOURS)., Disp: , Rfl:  .  amLODipine (NORVASC) 5 MG tablet, Take 5 mg by mouth daily., Disp: , Rfl:  .  Cholecalciferol (VITAMIN D3 SUPER STRENGTH) 50 MCG (2000 UT) CAPS, Take 2,000 Units by mouth daily., Disp: , Rfl:  .  divalproex (DEPAKOTE SPRINKLE) 125 MG capsule, Take 125 mg by mouth 2 (two) times daily., Disp: , Rfl:  .  donepezil (ARICEPT) 10 MG tablet, Take 10 mg by mouth every evening., Disp: , Rfl:  .  guaifenesin (ROBITUSSIN) 100 MG/5ML syrup, Take 200 mg by mouth every 6 (six) hours as needed for cough., Disp: , Rfl:  .  loperamide (IMODIUM) 2 MG capsule, Take 2 mg by mouth as needed (for diarrhea with each loose stool- MAX OF 8 DOSES/24 HOURS)., Disp: , Rfl:  .  losartan (COZAAR) 100 MG tablet, Take 100 mg by mouth daily., Disp: , Rfl:  .  magnesium hydroxide (MILK OF MAGNESIA) 400 MG/5ML suspension, Take 30 mLs by mouth at bedtime as needed for mild constipation., Disp: , Rfl:  .  MINTOX 200-200-20 MG/5ML suspension, Take 30 mLs by mouth every 6 (six) hours as needed for indigestion or heartburn (CANNOT EXCEED 4 DOSES/24 HOURS)., Disp: , Rfl:  .  Neomycin-Bacitracin-Polymyxin (TRIPLE ANTIBIOTIC FIRST AID) [phone] OINT, Apply 1 application topically daily as needed (minor skin tears or abrasions- clean with normal saline first and bandage afterwards)., Disp: , Rfl:  .  sertraline (ZOLOFT) 25 MG tablet, Take 25 mg by mouth daily., Disp: , Rfl:  .  alum & mag hydroxide-simeth (MAALOX/MYLANTA) 200-200-20 MG/5ML suspension, Take 30 mLs by mouth daily as needed for indigestion or heartburn. (Patient not taking: Reported on 10/08/2020), Disp: , Rfl:  .  Cholecalciferol (VITAMIN D) 50 MCG (2000 UT) CAPS, Take 2,000 Units by mouth daily. (Patient not taking: Reported on  10/08/2020), Disp: , Rfl:  .  Multiple Vitamins-Minerals (THERA-M MULTIPLE VITAMINS PO), Take 1 tablet by mouth daily. (Patient not taking: Reported on 10/08/2020), Disp: , Rfl:  .  rivastigmine (EXELON) 4.6 mg/24hr, Place 1 patch (4.6 mg total) onto the skin daily. (Patient not taking: Reported on 10/08/2020), Disp: 30 patch, Rfl: 1   Exam: Current vital signs: BP (!) 134/59   Pulse (!) 55   Temp 97.8 F (36.6 C) (Axillary)   Resp 14   SpO2 100%  Vital signs in last 24 hours: Temp:  [97.8 F (36.6 C)] 97.8 F (36.6 C) (05/19 1035) Pulse Rate:  [51-90] 55 (05/19 1500) Resp:  [13-22] 14 (05/19 1500) BP: (90-165)/(43-83) 134/59 (05/19 1500) SpO2:  [98 %-100 %] 100 % (05/19 1500)  PE: GENERAL: Very poor appearing elderly female lying on her side. Opens her eyes when stimulated. NAD. HEENT: - Normocephalic and atraumatic LUNGS - Normal respiratory effort.  CV - RRR on tele ABDOMEN - Soft, nontender Ext: warm, well perfused Psych: unresponsive  except to pain.   NEURO:  Mental Status: Patient has no verbal response to orientation questions nor other history questions. Does not follow commands.  Speech/Language: She spoke 2 words to NP which were both profane.  Cranial Nerves:  II: PERRL 2 mm sluggish. III, IV, VI: eyes are midline. No upward or lateral gaze.  V, VII: No response with brow pressure. Resting face appears symmetrical.  VIII: hearing ?  intact to voice. IX, X: Phonation normal with 2 words spoken. Not conversational.  XII: will not open mouth to command.  Motor: No noted spontaneous movement of extremities. Only response is to painful stimuli. Will not participate in strength exam.  Sensation- withdraws to pain with painful stimuli.  Coordination:No tremors noted.  Gait: deferred  Labs I have reviewed labs in epic and the results pertinent to this consultation are: Na 140.  UA negative.  Culture pending.   CBC    Component Value Date/Time   WBC 7.3 10/08/2020  1106   RBC 3.58 (L) 10/08/2020 1106   HGB 11.6 (L) 10/08/2020 1106   HCT 36.4 10/08/2020 1106   PLT 231 10/08/2020 1106   MCV 101.7 (H) 10/08/2020 1106   MCH 32.4 10/08/2020 1106   MCHC 31.9 10/08/2020 1106   RDW 14.1 10/08/2020 1106   LYMPHSABS 1.0 08/05/2020 1509   MONOABS 0.7 08/05/2020 1509   EOSABS 0.0 08/05/2020 1509   BASOSABS 0.0 08/05/2020 1509    CMP     Component Value Date/Time   NA 140 10/08/2020 1106   K 3.8 10/08/2020 1106   CL 106 10/08/2020 1106   CO2 24 10/08/2020 1106   GLUCOSE 124 (H) 10/08/2020 1106   BUN 12 10/08/2020 1106   CREATININE 0.73 10/08/2020 1106   CREATININE 0.59 08/27/2013 1549   CALCIUM 8.4 (L) 10/08/2020 1106   PROT 5.5 (L) 08/06/2020 0545   ALBUMIN 2.7 (L) 08/06/2020 0545   AST 44 (H) 08/06/2020 0545   ALT 18 08/06/2020 0545   ALKPHOS 63 08/06/2020 0545   BILITOT 0.1 (L) 08/06/2020 0545   GFRNONAA >60 10/08/2020 1106   GFRNONAA 89 08/27/2013 1549   GFRAA >89 08/27/2013 1549   Imaging  CT head No acute intracranial findings. Hydrocephalus increased from CT 09/12/2014. No change from MRI 08/05/2020.  MRI brain pending.   EEG This study is suggestive of moderate diffuse encephalopathy, nonspecific etiology. No seizures or epileptiform discharges were seen throughout the recording.   Assessment: 87 yo female with a PMHx significant for advanced Alzheimer's dementia, stroke, and hydrocephalus. No history of seizures. Apparently, she had 3 seizures today, witnessed, but NP does not have anyone to describe the activity. -She was loaded with Keppra in ED, and no further seizure like activity. However, due to her behavior disturbance with dementia, do not want to use Keppra long term.  -EEG negative for seizures or epileptiform discharges, but positive for moderate diffuse encephalopathy. EEG performed after patient receiving Versed.  -Depakote is noted on facility medications, but dose is low, so it is likely for behavioral issues with  dementia. Get state VA level prior to load. We will load with Depacon 20 mg per kg x 1 and start 500mg  IV q 12 hours.  -At this point, there is no infection or metabolic derangement that could have caused new onset of seizure. Likely, attributed to dementia and her history of old strokes.  -The hydrocephalus is interesting as it could be a cause, but will compare MRI brains before classifying.   Impression: -  new onset seizure. -advanced Alzheimer's disease with behavioral disturbance. Son states this is her baseline seen today on exam.  -hydrocephalus on past MRI and increased on today's CTH.   Recommendations: -medicine admit for observation.  -seizure precautions.  -Ativan 1-2mg  IV q 4 hours prn seizure lasting more than 3 minutes.  -Continue to work up infectious and metabolic causes for seizure as you are doing.  -Vitamin B12 level. Supplement if lower than 500.  -TSH.  -stat VPA level prior to load -Load Depacon 20mg  per kg x 1.  -AEDs-Depacon 250 mg IV q12 hours after load. -Full code confirmed by NP.  -Not sure if MRI will add value due to poor baseline  Plan discussed with teaching service.   Pt seen by Clance Boll, NP/Neuro and later by MD. Note/plan to be edited by MD as needed.  Pager: [phone]  Attending Neurohospitalist Addendum Patient seen and examined with APP/Resident. Agree with the history and physical as documented above. Agree with the plan as documented, which I helped formulate. I have independently reviewed the chart, obtained history, review of systems and examined the patient.I have personally reviewed pertinent head/neck/spine imaging (CT/MRI). Please feel free to call with any questions.  Poor mRS - 5 at baseline On VPA at home 125 BID - Will increase to 250 BID Check for any evidence of underlying infection and treat per pimary team Per family report now baseline. Will not recommend MRI due to not much of change in plan no matter what the  result is given her poor baseline.  Please call with questions.    -- Amie Portland, MD Neurologist [facility] Pager: [phone]

## 2020-10-08 NOTE — ED Provider Notes (Signed)
[facility] EMERGENCY DEPARTMENT Provider Note   CSN: [id number] Arrival date & time:        History Chief Complaint  Patient presents with  . Seizures    Wanda Conner is a 87 y.o. female.  Presented to the emergency room with concern for new onset seizure.  Per EMS report, patient had 2 to 3-minute episode of seizure activity facility.  Patient had 2 additional seizures with EMS and received Versed.  Additional history obtained from chart review and discussion with son at bedside.  Son states that patient at baseline at times is somnolent and lethargic and current status is consistent with baseline.  At times however patient is more alert and interactive.  Has advanced dementia and lives in a nursing facility.  Son states that she is full code.    HPI     Past Medical History:  Diagnosis Date  . Agitation 09/02/2014  . Anemia    History of this  . Arthritis   . Dementia with behavioral disturbance ([place]) 08/21/2014  . Depression   . Elevated vitamin B12 level 09/02/2014  . GERD (gastroesophageal reflux disease)   . Hypertension, essential, benign 11/21/2011  . Osteoarthritis of left knee 10/31/2011   Status post knee replacement on 10/31/2011.   Marland Kitchen Palpitations 12/12/2012  . Personal history of peptic ulcer disease 11/12/2009   Qualifier: Diagnosis of  By: Mingo Amber MD, Merry Proud    . Seasonal allergies   . Urge incontinence 12/29/2010  . Uterine prolapse 10/25/2013  . Weight loss, unintentional 09/18/2014    Patient Active Problem List   Diagnosis Date Noted  . Hypernatremia 08/06/2020  . Alzheimer's dementia with behavioral disturbance ([place]) 08/06/2020  . Thrombocytopenia ([place]) 08/06/2020  . Lactic acidosis 08/06/2020  . Acute metabolic encephalopathy 69/67/8938  . Open wound of foot except toe(s) alone 11/20/2014  . Weight loss, unintentional 09/18/2014  . Uterine prolapse 10/25/2013  . Bony growth 12/12/2012  . Hypertension, essential, benign 11/21/2011  .  Osteoarthritis of left knee 10/31/2011  . Preventative health care 12/29/2010  . Urge incontinence 12/29/2010    Past Surgical History:  Procedure Laterality Date  . BREAST SURGERY     cyst removal  . JOINT REPLACEMENT     Right kee replacement 2009  . TOTAL KNEE ARTHROPLASTY  10/31/2011   Procedure: TOTAL KNEE ARTHROPLASTY;  Surgeon: Kerin Salen, MD;  Location: [facility];  Service: Orthopedics;  Laterality: Left;     OB History   No obstetric history on file.     Family History  Problem Relation Age of Onset  . Diabetes Father   . Diabetes Brother     Social History   Tobacco Use  . Smoking status: Never Smoker  . Smokeless tobacco: Never Used  Substance Use Topics  . Alcohol use: No  . Drug use: No    Home Medications Prior to Admission medications   Medication Sig Start Date End Date Taking? Authorizing Provider  acetaminophen (TYLENOL) 500 MG tablet Take 500 mg by mouth every 4 (four) hours as needed for headache, mild pain or fever (CANNOT EXCEED 2,000 MG/24 HOURS).   Yes [provider]  amLODipine (NORVASC) 5 MG tablet Take 5 mg by mouth daily.   Yes [provider]  Cholecalciferol (VITAMIN D3 SUPER STRENGTH) 50 MCG (2000 UT) CAPS Take 2,000 Units by mouth daily.   Yes [provider]  divalproex (DEPAKOTE SPRINKLE) 125 MG capsule Take 125 mg by mouth 2 (two) times  daily.   Yes [provider]  donepezil (ARICEPT) 10 MG tablet Take 10 mg by mouth every evening.   Yes [provider]  guaifenesin (ROBITUSSIN) 100 MG/5ML syrup Take 200 mg by mouth every 6 (six) hours as needed for cough.   Yes [provider]  loperamide (IMODIUM) 2 MG capsule Take 2 mg by mouth as needed (for diarrhea with each loose stool- MAX OF 8 DOSES/24 HOURS).   Yes [provider]  losartan (COZAAR) 100 MG tablet Take 100 mg by mouth daily.   Yes [provider]  magnesium hydroxide (MILK OF MAGNESIA) 400 MG/5ML  suspension Take 30 mLs by mouth at bedtime as needed for mild constipation.   Yes [provider]  [place] 200-200-20 MG/5ML suspension Take 30 mLs by mouth every 6 (six) hours as needed for indigestion or heartburn (CANNOT EXCEED 4 DOSES/24 HOURS).   Yes [provider]  Neomycin-Bacitracin-Polymyxin (TRIPLE ANTIBIOTIC FIRST AID) [phone] OINT Apply 1 application topically daily as needed (minor skin tears or abrasions- clean with normal saline first and bandage afterwards).   Yes [provider]  sertraline (ZOLOFT) 25 MG tablet Take 25 mg by mouth daily.   Yes [provider]  alum & mag hydroxide-simeth (MAALOX/MYLANTA) 200-200-20 MG/5ML suspension Take 30 mLs by mouth daily as needed for indigestion or heartburn. Patient not taking: Reported on 10/08/2020    [provider]  Cholecalciferol (VITAMIN D) 50 MCG (2000 UT) CAPS Take 2,000 Units by mouth daily. Patient not taking: Reported on 10/08/2020    [provider]  Multiple Vitamins-Minerals (THERA-M MULTIPLE VITAMINS PO) Take 1 tablet by mouth daily. Patient not taking: Reported on 10/08/2020    [provider]  rivastigmine (EXELON) 4.6 mg/24hr Place 1 patch (4.6 mg total) onto the skin daily. Patient not taking: Reported on 10/08/2020 11/20/14   McDiarmid, Blane Ohara, MD    Allergies    Omeprazole  Review of Systems   Review of Systems  Unable to perform ROS: Dementia     Physical Exam Updated Vital Signs BP (!) 108/53   Pulse 61   Temp 97.8 F (36.6 C) (Axillary)   Resp 14   SpO2 100%   Physical Exam Vitals and nursing note reviewed.  Constitutional:      Appearance: She is well-developed.     Comments: Appears somewhat lethargic, responds to painful stimuli  HENT:     Head: Normocephalic and atraumatic.  Eyes:     Conjunctiva/sclera: Conjunctivae normal.  Cardiovascular:     Rate and Rhythm: Normal rate and regular rhythm.     Heart sounds: No murmur  heard.   Pulmonary:     Effort: Pulmonary effort is normal. No respiratory distress.     Breath sounds: Normal breath sounds.  Abdominal:     Palpations: Abdomen is soft.     Tenderness: There is no abdominal tenderness.  Musculoskeletal:        General: No deformity or signs of injury.     Cervical back: Neck supple.  Skin:    General: Skin is warm and dry.  Neurological:     Comments: Localizes pain, opens eyes to painful stimuli, incomprehensible verbal response  Psychiatric:        Mood and Affect: Mood normal.       ED Results / Procedures / Treatments   Labs (all labs ordered are listed, but only abnormal results are displayed) Labs Reviewed  CBC - Abnormal; Notable for the following components:  Result Value   RBC 3.58 (*)    Hemoglobin 11.6 (*)    MCV 101.7 (*)    All other components within normal limits  BASIC METABOLIC PANEL - Abnormal; Notable for the following components:   Glucose, Bld 124 (*)    Calcium 8.4 (*)    All other components within normal limits  CBG MONITORING, ED - Abnormal; Notable for the following components:   Glucose-Capillary 105 (*)    All other components within normal limits  SARS CORONAVIRUS 2 (TAT 6-24 HRS)  MAGNESIUM  URINALYSIS, ROUTINE W REFLEX MICROSCOPIC  TSH  VITAMIN B12  FOLATE  HEPATIC FUNCTION PANEL    EKG EKG Interpretation  Date/Time:  Thursday Oct 08 2020 10:32:25 EDT Ventricular Rate:  76 PR Interval:  141 QRS Duration: 141 QT Interval:  464 QTC Calculation: 522 R Axis:   -72 Text Interpretation: Sinus rhythm Biatrial enlargement RBBB and LAFB Left ventricular hypertrophy Confirmed by Madalyn Rob [phone]) on 10/08/2020 11:40:22 AM   Radiology CT Head Wo Contrast  Result Date: 10/08/2020 CLINICAL DATA:  Seizure. EXAM: CT HEAD WITHOUT CONTRAST TECHNIQUE: Contiguous axial images were obtained from the base of the skull through the vertex without intravenous contrast. COMPARISON:  08/05/2020  FINDINGS: Brain: No acute intracranial hemorrhage. No focal mass lesion. No CT evidence of acute infarction. No midline shift or mass effect. No hydrocephalus. Basilar cisterns are patent. There are periventricular and subcortical white matter hypodensities. Generalized cortical atrophy. Moderate hydrocephalus disproportionate to the degree of atrophy. Temporal lobes are prominent. Hydrocephalus not changed from recent CT. Vascular: No hyperdense vessel or unexpected calcification. Skull: Normal. Negative for fracture or focal lesion. Sinuses/Orbits: Paranasal sinuses and mastoid air cells are clear. Orbits are clear. Other: None. IMPRESSION: 1. No acute intracranial findings. 2. Hydrocephalus increased from CT 09/12/2014. No change from MRI 08/05/2020. Electronically Signed   By: Suzy Bouchard M.D.   On: 10/08/2020 12:58    Procedures Procedures   Medications Ordered in ED Medications  levETIRAcetam (KEPPRA) IVPB 1000 mg/100 mL premix (0 mg Intravenous Stopped 10/08/20 1145)    ED Course  I have reviewed the triage vital signs and the nursing notes.  Pertinent labs & imaging results that were available during my care of the patient were reviewed by me and considered in my medical decision making (see chart for details).    MDM Rules/Calculators/A&P                         87 year old lady with extensive past medical history including dementia presenting to ER from memory care facility after episode of seizure.  2 additional seizures with EMS per their report.  In ER, patient appeared somewhat lethargic, only responding to painful stimuli however son at bedside reported this is her baseline.  Basic labs stable, CT head negative for acute process.  Loaded with Keppra.  Discussed with neurology, Dr. Malen Gauze.  He will evaluate but given new onset seizures, multiple seizure episodes, recommends admission for further management and observation.  Discussed with internal medicine resident, they will  accept to their service.  Final Clinical Impression(s) / ED Diagnoses Final diagnoses:  Seizure ([place])  Dementia without behavioral disturbance, unspecified dementia type ([place])  Encephalopathy    Rx / DC Orders ED Discharge Orders    None       Lucrezia Starch, MD 10/08/20 1346

## 2020-10-09 DIAGNOSIS — R569 Unspecified convulsions: Secondary | ICD-10-CM | POA: Diagnosis not present

## 2020-10-09 DIAGNOSIS — E441 Mild protein-calorie malnutrition: Secondary | ICD-10-CM

## 2020-10-09 DIAGNOSIS — F0390 Unspecified dementia without behavioral disturbance: Secondary | ICD-10-CM | POA: Diagnosis not present

## 2020-10-09 LAB — COMPREHENSIVE METABOLIC PANEL
ALT: 14 U/L (ref 0–44)
AST: 19 U/L (ref 15–41)
Albumin: 2.7 g/dL — ABNORMAL LOW (ref 3.5–5.0)
Alkaline Phosphatase: 81 U/L (ref 38–126)
Anion gap: 5 (ref 5–15)
BUN: 8 mg/dL (ref 8–23)
CO2: 29 mmol/L (ref 22–32)
Calcium: 8.6 mg/dL — ABNORMAL LOW (ref 8.9–10.3)
Chloride: 107 mmol/L (ref 98–111)
Creatinine, Ser: 0.58 mg/dL (ref 0.44–1.00)
GFR, Estimated: >60 mL/min (ref >60)
Glucose, Bld: 80 mg/dL (ref 70–99)
Potassium: 3.7 mmol/L (ref 3.5–5.1)
Sodium: 141 mmol/L (ref 135–145)
Total Bilirubin: <0.1 mg/dL — ABNORMAL LOW (ref 0.3–1.2)
Total Protein: 5.9 g/dL — ABNORMAL LOW (ref 6.5–8.1)

## 2020-10-09 LAB — CBC
HCT: 34.6 % — ABNORMAL LOW (ref 36.0–46.0)
Hemoglobin: 11 g/dL — ABNORMAL LOW (ref 12.0–15.0)
MCH: 31.8 pg (ref 26.0–34.0)
MCHC: 31.8 g/dL (ref 30.0–36.0)
MCV: 100 fL (ref 80.0–100.0)
Platelets: 230 10*3/uL (ref 150–400)
RBC: 3.46 MIL/uL — ABNORMAL LOW (ref 3.87–5.11)
RDW: 13.6 % (ref 11.5–15.5)
WBC: 6.2 10*3/uL (ref 4.0–10.5)
nRBC: 0 % (ref 0.0–0.2)

## 2020-10-09 MED ORDER — DIVALPROEX SODIUM 250 MG PO DR TAB: 250.0000 mg | DELAYED_RELEASE_TABLET | Freq: Two times a day (BID) | ORAL | 2 refills | Status: DC

## 2020-10-09 MED ORDER — DIVALPROEX SODIUM 125 MG PO CSDR: 250.0000 mg | DELAYED_RELEASE_CAPSULE | Freq: Two times a day (BID) | ORAL | 0 refills | Status: DC

## 2020-10-09 MED ORDER — VALPROATE SODIUM 100 MG/ML IV SOLN
250.0000 mg | Freq: Once | INTRAVENOUS | Status: AC
  Filled 2020-10-09: qty 2.5

## 2020-10-09 NOTE — Evaluation (Addendum)
Physical Therapy Evaluation & Discharge Patient Details Name: Wanda Conner MRN: [id number] DOB: [birth year] Today's Date: 10/09/2020   History of Present Illness  87 y/o female presented to ED on 5/19 following new onset seizure with 2 additional seizures with EMS. EEG negative for seizures but suggestive of moderate diffuse encephalopathy. MRI and CT head negative for acute abnormalities. PMH: dementia, GERD, HTN  Clinical Impression  PTA, patient from SNF and per chart review, patient mostly bedbound and staff will transfer to w/c at times. Unsure of PLOF due to advanced dementia. Patient currently totalA+2 for bed mobility and resistant to movement throughout session. Patient seems to be at baseline functioning. No further skilled PT needs acutely. Recommend return to SNF at discharge.     Follow Up Recommendations Other (comment) (return to SNF)    Equipment Recommendations  None recommended by PT    Recommendations for Other Services       Precautions / Restrictions Precautions Precautions: Fall Restrictions Weight Bearing Restrictions: No      Mobility  Bed Mobility Overal bed mobility: Needs Assistance Bed Mobility: Rolling;Sidelying to Sit;Sit to Sidelying Rolling: Total assist;+2 for physical assistance;+2 for safety/equipment Sidelying to sit: Total assist;+2 for physical assistance;+2 for safety/equipment     Sit to sidelying: Total assist;+2 for physical assistance;+2 for safety/equipment General bed mobility comments: totalA+2 for all aspects of bed mobility. Resistant to movement. Pushing posteriorly upon sitting EOB, fearful with attempts for anterior translation.    Transfers                 General transfer comment: deferred; poor sitting balance  Ambulation/Gait                Stairs            Wheelchair Mobility    Modified Rankin (Stroke Patients Only)       Balance Overall balance assessment: Needs  assistance Sitting-balance support: No upper extremity supported;Feet supported Sitting balance-Leahy Scale: Zero Sitting balance - Comments: totalA to maintain sitting balance Postural control: Posterior lean                                   Pertinent Vitals/Pain Pain Assessment: Faces Faces Pain Scale: No hurt Pain Intervention(s): Monitored during session    Home Living Family/patient expects to be discharged to:: Skilled nursing facility                      Prior Function Level of Independence: Needs assistance   Gait / Transfers Assistance Needed: bedbound primarily per chart review. SNF reports getting into w/c but unsure of how they transfer her     Comments: Difficulty obtaining PLOF due to impaired cognition     Hand Dominance        Extremity/Trunk Assessment   Upper Extremity Assessment Upper Extremity Assessment: Defer to OT evaluation    Lower Extremity Assessment Lower Extremity Assessment: Difficult to assess due to impaired cognition (Limited B knee and hip flexion noted with PROM. Minimal active movement)       Communication   Communication: Other (comment) (Minimal verbalizations other than name)  Cognition Arousal/Alertness: Awake/alert Behavior During Therapy: Flat affect Overall Cognitive Status: History of cognitive impairments - at baseline  General Comments: advanced dementia. Not following commands. Able to state name with increased time.      General Comments      Exercises     Assessment/Plan    PT Assessment Patent does not need any further PT services  PT Problem List         PT Treatment Interventions      PT Goals (Current goals can be found in the Care Plan section)  Acute Rehab PT Goals Patient Stated Goal: none stated PT Goal Formulation: Patient unable to participate in goal setting    Frequency     Barriers to discharge         Co-evaluation               AM-PAC PT "6 Clicks" Mobility  Outcome Measure Help needed turning from your back to your side while in a flat bed without using bedrails?: Total Help needed moving from lying on your back to sitting on the side of a flat bed without using bedrails?: Total Help needed moving to and from a bed to a chair (including a wheelchair)?: Total Help needed standing up from a chair using your arms (e.g., wheelchair or bedside chair)?: Total Help needed to walk in hospital room?: Total Help needed climbing 3-5 steps with a railing? : Total 6 Click Score: 6    End of Session   Activity Tolerance: Patient tolerated treatment well Patient left: in bed;with call bell/phone within reach;with bed alarm set   PT Visit Diagnosis: Muscle weakness (generalized) (M62.81)    Time: 1779-3903 PT Time Calculation (min) (ACUTE ONLY): 18 min   Charges:   PT Evaluation $PT Eval Moderate Complexity: 1 Mod          Wanda Conner PT, DPT Acute Rehabilitation Services Pager [phone] Office [phone]   [name] 10/09/2020, 10:21 AM

## 2020-10-09 NOTE — Progress Notes (Signed)
Pt earlier discharged, waiting for PTAR for transport to nursing home, transport came and picked up pt alongside her belongings at 2115, pt reassured. [name], [name]

## 2020-10-09 NOTE — Evaluation (Signed)
Clinical/Bedside Swallow Evaluation Patient Details  Name: Wanda Conner MRN: [id number] Date of Birth: 10-19-1933  Today's Date: 10/09/2020 Time: SLP Start Time (ACUTE ONLY): 3500 SLP Stop Time (ACUTE ONLY): 0935 SLP Time Calculation (min) (ACUTE ONLY): 17 min  Past Medical History:  Past Medical History:  Diagnosis Date  . Agitation 09/02/2014  . Anemia    History of this  . Arthritis   . Dementia with behavioral disturbance ([place]) 08/21/2014  . Depression   . Elevated vitamin B12 level 09/02/2014  . GERD (gastroesophageal reflux disease)   . Hypertension, essential, benign 11/21/2011  . Osteoarthritis of left knee 10/31/2011   Status post knee replacement on 10/31/2011.   Marland Kitchen Palpitations 12/12/2012  . Personal history of peptic ulcer disease 11/12/2009   Qualifier: Diagnosis of  By: Mingo Amber MD, Merry Proud    . Seasonal allergies   . Urge incontinence 12/29/2010  . Uterine prolapse 10/25/2013  . Weight loss, unintentional 09/18/2014   Past Surgical History:  Past Surgical History:  Procedure Laterality Date  . BREAST SURGERY     cyst removal  . JOINT REPLACEMENT     Right kee replacement 2009  . TOTAL KNEE ARTHROPLASTY  10/31/2011   Procedure: TOTAL KNEE ARTHROPLASTY;  Surgeon: Kerin Salen, MD;  Location: [facility];  Service: Orthopedics;  Laterality: Left;   HPI:  87 year old female with a history of Alzheimer's dementia, HTN, HLD, and hydrocephalous who presented to [facility] ED from her memory unit after a witnessed seizure that lasted approximately 2 minutes MRI brain 10/08/20 negative for acute processes; moderate ischemic changes; BSE generated as pt is NPO. No [place] documented in chart.  Assessment / Plan / Recommendation Clinical Impression  Pt seen for a clinical swallowing evaluation with oral holding, delayed initiation of the swallow and impaired mastication noted during assessment of various consistencies including thin via cup/straw, ice chips, puree and soft solids.  SLP utilized  mod multimodal cues during consumption to elicit continued swallowing and min oral holding throughout assessment.  Pt unable to cough/initiate a dry swallow prior to assessment d/t cognitive impairment.  Most swallowing deficits listed above related to cognitive decline.  Recommend Dysphagia 2 (minced)/thin liquids with swallowing precautions in place including slow rate, mod multimodal cues, pt actively participating in PO intake (ie: holding cup or spoon while consuming meals), small bites/sips and straws allowed with volume regulated during meals/snacks.  ST will f/u while in acute setting for diet tolerance/education for caregivers re: aspiration/swallowing precautions.  Thank you for this consult. SLP Visit Diagnosis: Dysphagia, oropharyngeal phase (R13.12)    Aspiration Risk  Mild aspiration risk;Risk for inadequate nutrition/hydration    Diet Recommendation   Dysphagia 2/thin liquids  Medication Administration: Crushed with puree    Other  Recommendations Oral Care Recommendations: Oral care BID   Follow up Recommendations Skilled Nursing facility;24 hour supervision/assistance      Frequency and Duration min 1 x/week  1 week       Prognosis Prognosis for Safe Diet Advancement: Good Barriers to Reach Goals: Cognitive deficits      Swallow Study   General Date of Onset: 10/08/20 HPI: 87 year old female with a history of Alzheimer's dementia, HTN, HLD, and hydrocephalous who presented to [facility] ED from her memory unit after a witnessed seizure that lasted approximately 2 minutes Type of Study: Bedside Swallow Evaluation Previous Swallow Assessment: None documented Diet Prior to this Study: NPO Temperature Spikes Noted: No Respiratory Status: Room air History of Recent  Intubation: No Behavior/Cognition: Alert;Requires cueing;Doesn't follow directions Oral Cavity Assessment: Dry Oral Care Completed by SLP:  (unable to complete d/t pt noncompliance; attempted) Oral Cavity -  Dentition: Dentures, top;Dentures, bottom Self-Feeding Abilities: Needs assist;Needs set up Patient Positioning: Upright in bed Baseline Vocal Quality: Not observed Volitional Cough: Cognitively unable to elicit Volitional Swallow: Unable to elicit    Oral/Motor/Sensory Function Overall Oral Motor/Sensory Function: Other (comment) (unable to fully assess d/t cognition; generalized weakness/cognitive impairment affects swallowing)   Ice Chips Ice chips: Impaired Presentation: Spoon Oral Phase Functional Implications: Oral holding   Thin Liquid Thin Liquid: Impaired Presentation: Spoon;Cup;Straw Oral Phase Functional Implications: Oral holding Pharyngeal  Phase Impairments: Suspected delayed Swallow    Nectar Thick Nectar Thick Liquid: Not tested   Honey Thick Honey Thick Liquid: Not tested   Puree Puree: Impaired Presentation: Spoon Oral Phase Functional Implications: Oral holding Pharyngeal Phase Impairments: Suspected delayed Swallow   Solid     Solid: Impaired Presentation: Spoon Oral Phase Functional Implications: Oral holding;Impaired mastication;Prolonged oral transit Pharyngeal Phase Impairments: Suspected delayed Swallow      Elvina Sidle, M.S., CCC-SLP 10/09/2020,1:25 PM

## 2020-10-09 NOTE — TOC Transition Note (Signed)
Transition of Care [facility]) - CM/SW Discharge Note   Patient Details  Name: Wanda Conner MRN: [id number] Date of Birth: 1933-07-11  [facility] CM/SW Contact:  Bethann Berkshire, [place] Phone Number: 10/09/2020, 5:02 PM   Clinical Narrative:     [facility] supervisor was involved in d/c and spoke with [facility] [place]. FL2 and Discharge summary faxed. Freada Bergeron is reviewing to confirm d/c. CSW will schedule PTAR so pt does not leave late but still waiting on confirmation from [employer].  Patient will DC to: [facility] Anticipated DC date: 10/09/20 Family notified: Talissa, Apple (Son)  [phone] [facility] Phone) Transport by: Corey Harold   Per MD patient ready for DC to [facility] . RN, patient, patient's family, and facility notified of DC. Discharge Summary and FL2 sent to facility. RN to call report prior to discharge [phone]). DC packet on chart. Ambulance transport requested for patient.   CSW will sign off for now as social work intervention is no longer needed. Please consult [place] again if new needs arise.   Final next level of care: Memory Care Barriers to Discharge: No Barriers Identified   Patient Goals and CMS Choice        Discharge Placement              Patient chooses bed at:  [facility]) Patient to be transferred to facility by: [place] Name of family member notified: Maegan, Buller (Son)   [phone] [facility] Phone) Patient and family notified of of transfer: 10/09/20  Discharge Plan and Services                                     Social Determinants of Health (SDOH) Interventions     Readmission Risk Interventions No flowsheet data found.

## 2020-10-09 NOTE — NC FL2 (Signed)
[place] MEDICAID FL2 LEVEL OF CARE SCREENING TOOL     IDENTIFICATION  Patient Name: Wanda Conner Birthdate: 10-21-1933 Sex: female Admission Date (Current Location): 10/08/2020  [facility] and [place] Number:  Herbalist and Address:  [facility] [place]. [facility], [place] [address] [place], [place] [address]      Provider Number: [id number]  Attending Physician Name and Address:  Campbell Riches, MD  Relative Name and Phone Number:  Julizza, Sassone (Son)   [phone] [facility] Phone)    Current Level of Care: Hospital Recommended Level of Care: [place] Prior Approval Number:    Date Approved/Denied:   PASRR Number:    Discharge Plan: Other (Comment) (ALF)    Current Diagnoses: Patient Active Problem List   Diagnosis Date Noted  . Protein-calorie malnutrition, mild ([place])   . Seizure ([place]) 10/08/2020  . Hypernatremia 08/06/2020  . Alzheimer's dementia with behavioral disturbance ([place]) 08/06/2020  . Thrombocytopenia ([place]) 08/06/2020  . Lactic acidosis 08/06/2020  . Acute metabolic encephalopathy 22/06/5425  . Open wound of foot except toe(s) alone 11/20/2014  . Weight loss, unintentional 09/18/2014  . Dementia without behavioral disturbance ([place]) 08/21/2014  . Uterine prolapse 10/25/2013  . Bony growth 12/12/2012  . Hypertension, essential, benign 11/21/2011  . Osteoarthritis of left knee 10/31/2011  . Preventative health care 12/29/2010  . Urge incontinence 12/29/2010    Orientation RESPIRATION BLADDER Height & Weight     Self,Time,Situation,Place  Normal Incontinent Weight:   Height:     BEHAVIORAL SYMPTOMS/MOOD NEUROLOGICAL BOWEL NUTRITION STATUS    Convulsions/Seizures Incontinent Diet (Normal diet)  AMBULATORY STATUS COMMUNICATION OF NEEDS Skin   Extensive Assist Verbally Normal                       Personal Care Assistance Level of Assistance  Bathing,Feeding,Dressing Bathing Assistance: Maximum  assistance Feeding assistance: Independent Dressing Assistance: Maximum assistance     Functional Limitations Info  Sight,Speech,Hearing Sight Info: Adequate Hearing Info: Adequate Speech Info: Adequate    SPECIAL CARE FACTORS FREQUENCY                       Contractures Contractures Info: Not present    Additional Factors Info  Code Status,Allergies Code Status Info: Full Code Allergies Info: Omeprazole             Discharge Medications: TAKE these medications   acetaminophen 500 MG tablet Commonly known as: TYLENOL Take 500 mg by mouth every 4 (four) hours as needed for headache, mild pain or fever (CANNOT EXCEED 2,000 MG/24 HOURS).   amLODipine 5 MG tablet Commonly known as: NORVASC Take 5 mg by mouth daily.   divalproex 250 MG DR tablet Commonly known as: Depakote Take 1 tablet (250 mg total) by mouth 2 (two) times daily. What changed: You were already taking a medication with the same name, and this prescription was added. Make sure you understand how and when to take each.   divalproex 125 MG capsule Commonly known as: DEPAKOTE SPRINKLE Take 2 capsules (250 mg total) by mouth 2 (two) times daily. What changed: how much to take   donepezil 10 MG tablet Commonly known as: ARICEPT Take 10 mg by mouth every evening.   guaifenesin 100 MG/5ML syrup Commonly known as: ROBITUSSIN Take 200 mg by mouth every 6 (six) hours as needed for cough.   loperamide 2 MG capsule Commonly known as: IMODIUM Take 2 mg by mouth as  needed (for diarrhea with each loose stool- MAX OF 8 DOSES/24 HOURS).   losartan 100 MG tablet Commonly known as: COZAAR Take 100 mg by mouth daily.   magnesium hydroxide 400 MG/5ML suspension Commonly known as: MILK OF MAGNESIA Take 30 mLs by mouth at bedtime as needed for mild constipation.   Mintox 542-706-23 MG/5ML suspension Generic drug: alum & mag hydroxide-simeth Take 30 mLs by mouth every 6 (six) hours as needed  for indigestion or heartburn (CANNOT EXCEED 4 DOSES/24 HOURS). What changed: Another medication with the same name was removed. Continue taking this medication, and follow the directions you see here.   rivastigmine 4.6 mg/24hr Commonly known as: Exelon Place 1 patch (4.6 mg total) onto the skin daily.   sertraline 25 MG tablet Commonly known as: ZOLOFT Take 25 mg by mouth daily.   THERA-M MULTIPLE VITAMINS PO Take 1 tablet by mouth daily.   Triple Antibiotic First Aid [phone] Oint Apply 1 application topically daily as needed (minor skin tears or abrasions- clean with normal saline first and bandage afterwards).   Vitamin D3 Super Strength 50 MCG (2000 UT) Caps Generic drug: Cholecalciferol Take 2,000 Units by mouth daily. What changed: Another medication with the same name was removed. Continue taking this medication, and follow the directions you see here.     Relevant Imaging Results:  Relevant Lab Results:   Additional Information SSN [place], [place]

## 2020-10-09 NOTE — Plan of Care (Signed)

## 2020-10-09 NOTE — Evaluation (Addendum)
Occupational Therapy Evaluation Patient Details Name: Wanda Conner MRN: [id number] DOB: Mar 16, 1934 Today's Date: 10/09/2020    History of Present Illness 87 y/o female presented to ED on 5/19 following new onset seizure with 2 additional seizures with EMS. EEG negative for seizures but suggestive of moderate diffuse encephalopathy. MRI and CT head negative for acute abnormalities. PMH: dementia, GERD, HTN   Clinical Impression   Patient admitted from SNF memory care for above and limited by problem list below. Per chart review, patient mainly bed bound but will transfer to w/c with assist from staff and dependent for ADLs. Today she requires total assist for washing face, even resisting HOH support to engage; and total assist +2 for bed mobility.  She will require hoyer lift to transfer to w/c at this time.  Pt not following commands and requires increased time to state her name. Based on performance today, patient appears to be at baseline functional level.  No acute OT needs identified and OT will sign off.     Follow Up Recommendations  No OT follow up;Supervision/Assistance - 24 hour (return to SNF memory care)    Equipment Recommendations  None recommended by OT    Recommendations for Other Services       Precautions / Restrictions Precautions Precautions: Fall Restrictions Weight Bearing Restrictions: No      Mobility Bed Mobility Overal bed mobility: Needs Assistance Bed Mobility: Rolling;Sidelying to Sit;Sit to Sidelying Rolling: Total assist;+2 for physical assistance;+2 for safety/equipment Sidelying to sit: Total assist;+2 for physical assistance;+2 for safety/equipment     Sit to sidelying: Total assist;+2 for physical assistance;+2 for safety/equipment General bed mobility comments: totalA+2 for all aspects of bed mobility. Resistant to movement. Pushing posteriorly upon sitting EOB, fearful with attempts for anterior translation.    Transfers                  General transfer comment: deferred; poor sitting balance    Balance Overall balance assessment: Needs assistance Sitting-balance support: No upper extremity supported;Feet supported Sitting balance-Leahy Scale: Zero Sitting balance - Comments: totalA to maintain sitting balance Postural control: Posterior lean                                 ADL either performed or assessed with clinical judgement   ADL Overall ADL's : At baseline                                       General ADL Comments: total assist for all self care     Vision         Perception     Praxis      Pertinent Vitals/Pain Pain Assessment: Faces Faces Pain Scale: No hurt Pain Intervention(s): Monitored during session     Hand Dominance     Extremity/Trunk Assessment Upper Extremity Assessment Upper Extremity Assessment: Difficult to assess due to impaired cognition (pt resisting movement of UEs, able to scratch nose but resisting HOH assist to wash face)   Lower Extremity Assessment Lower Extremity Assessment: Defer to PT evaluation       Communication Communication Communication: Other (comment) (minimal verbalizations other than name)   Cognition Arousal/Alertness: Awake/alert Behavior During Therapy: Flat affect Overall Cognitive Status: History of cognitive impairments - at baseline  General Comments: advanced dementia. Not following commands. Able to state name with increased time.   General Comments       Exercises     Shoulder Instructions      Home Living Family/patient expects to be discharged to:: Skilled nursing facility                                        Prior Functioning/Environment Level of Independence: Needs assistance  Gait / Transfers Assistance Needed: bedbound primarily per chart review. SNF reports getting into w/c but unsure of how they transfer her ADL's /  Homemaking Assistance Needed: per chart dependent for all ADLS   Comments: Difficulty obtaining PLOF due to impaired cognition--per chart has been in SNF for 4 years        OT Problem List: Decreased strength;Decreased range of motion;Decreased activity tolerance;Impaired balance (sitting and/or standing);Decreased coordination;Decreased cognition;Decreased safety awareness      OT Treatment/Interventions:      OT Goals(Current goals can be found in the care plan section) Acute Rehab OT Goals Patient Stated Goal: none stated OT Goal Formulation: Patient unable to participate in goal setting  OT Frequency:     Barriers to D/C:            Co-evaluation PT/OT/SLP Co-Evaluation/Treatment: Yes Reason for Co-Treatment: Necessary to address cognition/behavior during functional activity;For patient/therapist safety;To address functional/ADL transfers   OT goals addressed during session: ADL's and self-care      AM-PAC OT "6 Clicks" Daily Activity     Outcome Measure Help from another person eating meals?: Total Help from another person taking care of personal grooming?: Total Help from another person toileting, which includes using toliet, bedpan, or urinal?: Total Help from another person bathing (including washing, rinsing, drying)?: Total Help from another person to put on and taking off regular upper body clothing?: Total Help from another person to put on and taking off regular lower body clothing?: Total 6 Click Score: 6   End of Session Nurse Communication: Mobility status  Activity Tolerance: Patient tolerated treatment well Patient left: in bed;with call bell/phone within reach;with bed alarm set  OT Visit Diagnosis: Other abnormalities of gait and mobility (R26.89);Other symptoms and signs involving cognitive function                Time: 1610-9604 OT Time Calculation (min): 17 min Charges:  OT General Charges $OT Visit: 1 Visit OT Evaluation $OT Eval Moderate  Complexity: 1 Mod  Jolaine Artist, OT Acute Rehabilitation Services Pager [phone] Office [phone]   Delight Stare 10/09/2020, 12:29 PM

## 2020-10-09 NOTE — TOC Progression Note (Addendum)
Transition of Care [facility]) - Progression Note    Patient Details  Name: Wanda Conner MRN: [id number] Date of Birth: 07-13-1933  Transition of [facility]) CM/SW [place], [place] Phone Number: 10/09/2020, 3:16 PM  Clinical Narrative:     CSW notified of pt's d/c. CSW called and left message with Claudette Stapler of [facility]. Left voicemail requesting return call.   1607: CSW called [facility] again. Spoke to staff who informed CSW that the admissions coordinator is out until next Wednesday. CSW asked how pt would be able to return in the mean time; CSW is informed that [facility] would need to review FL2 and DC summary prior to pt returning. CSW is informed that Development worker, international aid won't return until Monday. DC summary and FL2 would need to be faxed to [phone].        Expected Discharge Plan and Services           Expected Discharge Date: 10/09/20                                     Social Determinants of Health (SDOH) Interventions    Readmission Risk Interventions No flowsheet data found.

## 2020-10-09 NOTE — NC FL2 (Addendum)
  [place] MEDICAID FL2 LEVEL OF CARE SCREENING TOOL     IDENTIFICATION  Patient Name: Wanda Conner Birthdate: [birth year] Sex: female Admission Date (Current Location): 10/08/2020  [facility] and [place] Number:  Herbalist and Address:  The [place]. [facility], [place] [address], [place], [place] [address]      Provider Number: [id number]  Attending Physician Name and Address:  Campbell Riches, MD  Relative Name and Phone Number:  Hayle, Parisi (Son)   [phone] [facility] Phone)    Current Level of Care: Hospital Recommended Level of Care: Memory Care Prior Approval Number:    Date Approved/Denied:   PASRR Number:    Discharge Plan: Other (Comment) (AL[place])    Current Diagnoses: Patient Active Problem List   Diagnosis Date Noted  . Protein-calorie malnutrition, mild ([place])   . Seizure ([place]) 10/08/2020  . Hypernatremia 08/06/2020  . Alzheimer's dementia with behavioral disturbance ([place]) 08/06/2020  . Thrombocytopenia ([place]) 08/06/2020  . Lactic acidosis 08/06/2020  . Acute metabolic encephalopathy 94/50/3888  . Open wound of foot except toe(s) alone 11/20/2014  . Weight loss, unintentional 09/18/2014  . Dementia without behavioral disturbance ([place]) 08/21/2014  . Uterine prolapse 10/25/2013  . Bony growth 12/12/2012  . Hypertension, essential, benign 11/21/2011  . Osteoarthritis of left knee 10/31/2011  . Preventative health care 12/29/2010  . Urge incontinence 12/29/2010    Orientation RESPIRATION BLADDER Height & Weight     Self,Time,Situation,Place  Normal Incontinent Weight:   Height:     BEHAVIORAL SYMPTOMS/MOOD NEUROLOGICAL BOWEL NUTRITION STATUS    Convulsions/Seizures Incontinent Diet ([place] (Soft))  AMBULATORY STATUS COMMUNICATION OF NEEDS Skin   Extensive Assist Verbally Normal                       Personal Care Assistance Level of Assistance  Bathing,Feeding,Dressing Bathing Assistance: Maximum  assistance Feeding assistance: Limited assistance Dressing Assistance: Maximum assistance     Functional Limitations Info  Sight,Speech,Hearing Sight Info: Adequate Hearing Info: Adequate Speech Info: Adequate    SPECIAL CARE FACTORS FREQUENCY                       Contractures Contractures Info: Not present    Additional Factors Info  Code Status,Allergies Code Status Info: Full Code Allergies Info: Omeprazole           Current Medications (10/09/2020):  This is the current hospital active medication list Current Facility-Administered Medications  Medication Dose Route Frequency Provider Last Rate Last Admin  . acetaminophen (TYLENOL) tablet 650 mg  650 mg Oral Q6H PRN Aslam, Loralyn [place], MD       Or  . acetaminophen (TYLENOL) suppository 650 mg  650 mg Rectal Q6H PRN Aslam, Sadia, MD      . enoxaparin (LOVENOX) injection 40 mg  40 mg Subcutaneous Q24H Aslam, Sadia, MD   40 mg at 10/09/20 1523  . valproate (DEPACON) 250 mg in dextrose 5 % 50 mL IVPB  250 mg Intravenous Q12H Amie [place], MD 52.5 mL/hr at 10/09/20 1159 250 mg at 10/09/20 1159     Discharge Medications: Please see discharge summary for a list of discharge medications.  Relevant Imaging Results:  Relevant Lab Results:   Additional Information SSN [place]   [place], [place]

## 2020-10-09 NOTE — Discharge Summary (Addendum)
Name: Wanda Conner MRN: [id number] DOB: 10-17-1933 87 y.o. PCP: Patient, [name] (Inactive)  Date of Admission: 10/08/2020 10:23 AM Date of Discharge: 10/09/2020 Attending Physician: Campbell Riches, MD  Discharge Diagnosis: 1. Seizure secondary to advanced Alzheimer's dementia  2.  Protein calorie malnutrition  Discharge Medications: Allergies as of 10/09/2020      Reactions   Omeprazole Other (See Comments)   "It did me worse than it did good." Pt does not like this medication to treat her stomach symptoms.      Medication List    TAKE these medications   acetaminophen 500 MG tablet Commonly known as: TYLENOL Take 500 mg by mouth every 4 (four) hours as needed for headache, mild pain or fever (CANNOT EXCEED 2,000 MG/24 HOURS).   amLODipine 5 MG tablet Commonly known as: NORVASC Take 5 mg by mouth daily.   divalproex 250 MG DR tablet Commonly known as: Depakote Take 1 tablet (250 mg total) by mouth 2 (two) times daily. What changed: You were already taking a medication with the same name, and this prescription was added. Make sure you understand how and when to take each.   divalproex 125 MG capsule Commonly known as: DEPAKOTE SPRINKLE Take 2 capsules (250 mg total) by mouth 2 (two) times daily. What changed: how much to take   donepezil 10 MG tablet Commonly known as: ARICEPT Take 10 mg by mouth every evening.   guaifenesin 100 MG/5ML syrup Commonly known as: ROBITUSSIN Take 200 mg by mouth every 6 (six) hours as needed for cough.   loperamide 2 MG capsule Commonly known as: IMODIUM Take 2 mg by mouth as needed (for diarrhea with each loose stool- MAX OF 8 DOSES/24 HOURS).   losartan 100 MG tablet Commonly known as: COZAAR Take 100 mg by mouth daily.   magnesium hydroxide 400 MG/5ML suspension Commonly known as: MILK OF MAGNESIA Take 30 mLs by mouth at bedtime as needed for mild constipation.   Mintox 630-160-10 MG/5ML suspension Generic drug:  alum & mag hydroxide-simeth Take 30 mLs by mouth every 6 (six) hours as needed for indigestion or heartburn (CANNOT EXCEED 4 DOSES/24 HOURS). What changed: Another medication with the same name was removed. Continue taking this medication, and follow the directions you see here.   rivastigmine 4.6 mg/24hr Commonly known as: Exelon Place 1 patch (4.6 mg total) onto the skin daily.   sertraline 25 MG tablet Commonly known as: ZOLOFT Take 25 mg by mouth daily.   THERA-M MULTIPLE VITAMINS PO Take 1 tablet by mouth daily.   Triple Antibiotic First Aid [phone] Oint Apply 1 application topically daily as needed (minor skin tears or abrasions- clean with normal saline first and bandage afterwards).   Vitamin D3 Super Strength 50 MCG (2000 UT) Caps Generic drug: Cholecalciferol Take 2,000 Units by mouth daily. What changed: Another medication with the same name was removed. Continue taking this medication, and follow the directions you see here.       Disposition and follow-up:   Ms.Wanda Conner was discharged from [facility] in Stable condition.  At the hospital follow up visit please address:  1. Seizure secondary to advanced Alzheimer's dementia - depakote increased to 250 mg 2 times daily. Referral to palliative care.   Protein calorie malnutrition - evaluation by nutrition at her living facility. Would not recommend feeding tube  2.  Labs / imaging needed at time of follow-up: none  3.  Pending labs/ test needing  follow-up: none  Follow-up Appointments:   Hospital Course by problem list: 1. Seizure secondary to advanced Alzheimer's dementia  [place] a 87 year old female with a history of Alzheimer's dementia, HTN, HLD, and hydrocephalous who presented to [facility] ED from her memory unit after a witnessed seizure that lasted approximately 2 minutes. EMS was then called and she had an additional 2 seizures in transit to the ED that required  2.5 mg of versed. No further seizure activity was noted after arrival to the ED. She has no history of seizures prior to this.  No evidence of acute intracranial abnormality on CT or MRI. Hydrocephalous appears increased compared to CT on 04/22, but stable from MRI on 03/22. Normal glucose on presentation, no electrolyte imbalances, normal TSH , normal B12, and normal folate. No evidence of acute infection. Normal spot EEG. The cause of her presentation was determined to be progression of her hydrocephalous secondary to Alzheimer's dementia. Her depakote was increased to 250 mg 2 times daily by neurology to prevent further seizure activity. She was also cleared by [facility] and given a dysphagia 2 diet. Also recommend discussion with palliative care given progression of dementia.   2.  Protein calorie malnutrition Patient appears cachetic on exam and labs showed significantly decreased bilirubin, total protein, and albumin reflecting a state of protein calorie malnutrition. Recommend follow up with dietician at memory care living facility after discharge. Would not recommend feeding tube.   Discharge Exam:   BP (!) 159/59   Pulse (!) 54   Temp 98.3 F (36.8 C)   Resp 16   SpO2 100%  Discharge exam:  General: cachetic woman laying in bed in no acute distress, intermittently whistling  CV: regular rate and rhythm, no murmurs, rubs, or gallops, now lower extremity edema Pulm: anterior lung fileds CTA bilaterally, normal work of breathing Abd: soft, non tender, non distended, normal bowel sounds Neuro: patient moving all extremities spontaneously against gravity, opens eyes to voice. Otherwise nonresponsive and unable to cooperate with neuro exam. Pupils sluggishly reactive to light but midline without gaze preference.   Pertinent Labs, Studies, and Procedures:  CBG (last 3)  Recent Labs    10/08/20 1104  GLUCAP 105*   CBC Latest Ref Rng & Units 10/09/2020 10/08/2020 08/05/2020  WBC 4.0 - 10.5 K/uL  6.2 7.3 -  Hemoglobin 12.0 - 15.0 g/dL 11.0(L) 11.6(L) 11.9(L)  Hematocrit 36.0 - 46.0 % 34.6(L) 36.4 35.0(L)  Platelets 150 - 400 K/uL 230 231 -   CMP Latest Ref Rng & Units 10/09/2020 10/08/2020 08/06/2020  Glucose 70 - 99 mg/dL 80 124(H) 86  BUN 8 - 23 mg/dL 8 12 17   Creatinine 0.44 - 1.00 mg/dL 0.58 0.73 0.60  Sodium 135 - 145 mmol/L 141 140 143  Potassium 3.5 - 5.1 mmol/L 3.7 3.8 3.2(L)  Chloride 98 - 111 mmol/L 107 106 111  CO2 22 - 32 mmol/L 29 24 23   Calcium 8.9 - 10.3 mg/dL 8.6(L) 8.4(L) 8.0(L)  Total Protein 6.5 - 8.1 g/dL 5.9(L) 6.2(L) 5.5(L)  Total Bilirubin 0.3 - 1.2 mg/dL <0.1(L) 0.1(L) 0.1(L)  Alkaline Phos 38 - 126 U/L 81 74 63  AST 15 - 41 U/L 19 18 44(H)  ALT 0 - 44 U/L 14 14 18    Component Ref Range & Units 1 d ago  (10/08/20) 2 mo ago  (08/06/20) 2 mo ago  (08/05/20)  Magnesium 1.7 - 2.4 mg/dL 2.0  2.0 CM  2.1 CM    Component  Ref Range & Units 1 d ago 2 mo ago 6 yr ago 7 yr ago  TSH 0.350 - 4.500 uIU/mL 3.861  2.740 CM  1.175  1.188      Component Ref Range & Units 1 d ago 2 mo ago 6 yr ago  Vitamin B-12 180 - 914 pg/mL 437  550 CM  1,813High R      Ref Range & Units 1 d ago  Folate >5.9 ng/mL 12.9    Urinalysis    Component Value Date/Time   COLORURINE YELLOW 10/08/2020 1357   APPEARANCEUR CLEAR 10/08/2020 1357   LABSPEC 1.010 10/08/2020 1357   PHURINE 7.0 10/08/2020 1357   GLUCOSEU NEGATIVE 10/08/2020 1357   HGBUR NEGATIVE 10/08/2020 1357   [place] 10/08/2020 1357   KETONESUR NEGATIVE 10/08/2020 1357   PROTEINUR NEGATIVE 10/08/2020 1357   UROBILINOGEN 0.2 10/25/2011 1057   NITRITE NEGATIVE 10/08/2020 1357   LEUKOCYTESUR TRACE (A) 10/08/2020 1357    Ref Range & Units 1 d ago  Valproic Acid Lvl 50.0 - 100.0 ug/mL 42Low    CT head without contrast FINDINGS: Brain: No acute intracranial hemorrhage. No focal mass lesion. No CT evidence of acute infarction. No midline shift or mass effect. No hydrocephalus. Basilar cisterns are  patent.  There are periventricular and subcortical white matter hypodensities. Generalized cortical atrophy. Moderate hydrocephalus disproportionate to the degree of atrophy. Temporal lobes are prominent. Hydrocephalus not changed from recent CT.  Vascular: No hyperdense vessel or unexpected calcification.  Skull: Normal. Negative for fracture or focal lesion.  Sinuses/Orbits: Paranasal sinuses and mastoid air cells are clear. Orbits are clear.  Other: None.  IMPRESSION: 1. No acute intracranial findings. 2. Hydrocephalus increased from CT 09/12/2014. No change from MRI 08/05/2020.  MRI brain w/o contrast FINDINGS: Brain: There is no evidence of an acute infarct, mass, midline shift, or extra-axial fluid collection. A small chronic right parietal cortical infarct is again noted with hemosiderin deposition, and there is also an unchanged small chronic right cerebellar infarct. Patchy to confluent T2 hyperintensities in the cerebral white matter bilaterally are unchanged from the prior MRI and are nonspecific but compatible with moderate chronic small vessel ischemic disease. Prominent lateral and milder third ventriculomegaly is unchanged from the prior MRI and felt to be secondary to advanced cerebral atrophy rather than hydrocephalus. There is severe bilateral mesial temporal lobe volume loss.  Vascular: Major intracranial vascular flow voids are preserved.  Skull and upper cervical spine: Unremarkable bone marrow signal.  Sinuses/Orbits: Unremarkable orbits. Paranasal sinuses and mastoid air cells are clear.  Other: None.  IMPRESSION: 1. No acute intracranial abnormality. 2. Moderate chronic small vessel ischemic disease and advanced cerebral atrophy.  EKG Interpretation Date/Time:  Thursday Oct 08 2020 10:32:25 EDT Ventricular Rate:  76 PR Interval:  141 QRS Duration: 141 QT Interval:  464 QTC Calculation: 522 R Axis:   -72 Text  Interpretation: Sinus rhythm Biatrial enlargement RBBB and LAFB Left ventricular hypertrophy Confirmed by Madalyn Rob [phone]) on 10/08/2020 11:40:22 AM  Discharge Instructions: Discharge Instructions    Call MD for:  difficulty breathing, headache or visual disturbances   Complete by: As directed    Call MD for:  persistant dizziness or light-headedness   Complete by: As directed    Call MD for:  persistant nausea and vomiting   Complete by: As directed    Call MD for:  temperature >100.4   Complete by: As directed    DIET DYS 2   Complete by: As directed  Fluid consistency: Thin   Diet - low sodium heart healthy   Complete by: As directed    Discharge instructions   Complete by: As directed    It was a pleasure caring for you Ms. Worton. You were treated with medication for a new seizure. We believe the cause of this was progression of your Alzheimer's dementia. Neurology has increased your depakote to 250 mg 2 times a day to prevent further seizures.  Take care!   Increase activity slowly   Complete by: As directed    Increase activity slowly   Complete by: As directed       Signed: Harvie Heck, MD  [facility] PGY-2 10/09/2020, 3:06 PM   Pager: [phone]

## 2020-10-09 NOTE — Progress Notes (Signed)
Called and explained discharge instructions with patients son. Son verbalized understanding. [place] x3 to give report and no nurse came to phone. Discharge instructions sent with the patient via medic.

## 2020-10-09 NOTE — NC FL2 (Signed)
  [place] MEDICAID FL2 LEVEL OF CARE SCREENING TOOL     IDENTIFICATION  Patient Name: Wanda Conner Birthdate: September 15, 1933 Sex: female Admission Date (Current Location): 10/08/2020  [facility] and [place] Number:  Herbalist and Address:  [facility] [place] [address] [place], [place] [address]      Provider Number: [id number]  Attending Physician Name and Address:  Campbell Riches, MD  Relative Name and Phone Number:  [place], Stierwalt (Son)   [phone] [facility] Phone)    Current Level of Care: Hospital Recommended Level of Care: Memory Care Prior Approval Number:    Date Approved/Denied:   PASRR Number:    Discharge Plan: Other (Comment) (ALF)    Current Diagnoses: Patient Active Problem List   Diagnosis Date Noted  . Protein-calorie malnutrition, mild ([place])   . Seizure ([place]) 10/08/2020  . Hypernatremia 08/06/2020  . Alzheimer's dementia with behavioral disturbance ([place]) 08/06/2020  . Thrombocytopenia ([place]) 08/06/2020  . Lactic acidosis 08/06/2020  . Acute metabolic encephalopathy 94/17/4081  . Open wound of foot except toe(s) alone 11/20/2014  . Weight loss, unintentional 09/18/2014  . Dementia without behavioral disturbance ([place]) 08/21/2014  . Uterine prolapse 10/25/2013  . Bony growth 12/12/2012  . Hypertension, essential, benign 11/21/2011  . Osteoarthritis of left knee 10/31/2011  . Preventative health care 12/29/2010  . Urge incontinence 12/29/2010    Orientation RESPIRATION BLADDER Height & Weight     Self,Time,Situation,Place  Normal Incontinent Weight:   Height:     BEHAVIORAL SYMPTOMS/MOOD NEUROLOGICAL BOWEL NUTRITION STATUS    Convulsions/Seizures Incontinent Diet ([place] (Soft))  AMBULATORY STATUS COMMUNICATION OF NEEDS Skin   Extensive Assist Verbally Normal                       Personal Care Assistance Level of Assistance  Bathing,Feeding,Dressing Bathing Assistance: Maximum  assistance Feeding assistance: Limited assistance Dressing Assistance: Maximum assistance     Functional Limitations Info  Sight,Speech,Hearing Sight Info: Adequate Hearing Info: Adequate Speech Info: Adequate    SPECIAL CARE FACTORS FREQUENCY                       Contractures Contractures Info: Not present    Additional Factors Info  Code Status,Allergies Code Status Info: Full Code Allergies Info: Omeprazole           Current Medications (10/09/2020):  This is the current hospital active medication list Current Facility-Administered Medications  Medication Dose Route Frequency Provider Last Rate Last Admin  . acetaminophen (TYLENOL) tablet 650 mg  650 mg Oral Q6H PRN Aslam, Loralyn [place], MD       Or  . acetaminophen (TYLENOL) suppository 650 mg  650 mg Rectal Q6H PRN Aslam, Sadia, MD      . enoxaparin (LOVENOX) injection 40 mg  40 mg Subcutaneous Q24H Aslam, Sadia, MD   40 mg at 10/09/20 1523  . valproate (DEPACON) 250 mg in dextrose 5 % 50 mL IVPB  250 mg Intravenous Q12H Amie [place], MD 52.5 mL/hr at 10/09/20 1159 250 mg at 10/09/20 1159     Discharge Medications: Please see discharge summary for a list of discharge medications.  Relevant Imaging Results:  Relevant Lab Results:   Additional Information SSN [place]   [place], [place]

## 2021-02-18 ENCOUNTER — Other Ambulatory Visit: Payer: Self-pay

## 2021-02-18 ENCOUNTER — Emergency Department (HOSPITAL_COMMUNITY): Payer: Medicare Other

## 2021-02-18 ENCOUNTER — Inpatient Hospital Stay (HOSPITAL_COMMUNITY)
Admission: EM | Admit: 2021-02-18 | Discharge: 2021-02-23 | DRG: 101 | Disposition: A | Discharge status: Hospice | Payer: Medicare Other | Source: Skilled Nursing Facility | Attending: Internal Medicine | Admitting: Internal Medicine

## 2021-02-18 ENCOUNTER — Observation Stay (HOSPITAL_COMMUNITY): Payer: Medicare Other

## 2021-02-18 ENCOUNTER — Encounter (HOSPITAL_COMMUNITY): Payer: Self-pay | Admitting: Emergency Medicine

## 2021-02-18 DIAGNOSIS — G9340 Encephalopathy, unspecified: Secondary | ICD-10-CM | POA: Diagnosis present

## 2021-02-18 DIAGNOSIS — F32A Depression, unspecified: Secondary | ICD-10-CM | POA: Diagnosis present

## 2021-02-18 DIAGNOSIS — Z7401 Bed confinement status: Secondary | ICD-10-CM

## 2021-02-18 DIAGNOSIS — Z888 Allergy status to other drugs, medicaments and biological substances status: Secondary | ICD-10-CM

## 2021-02-18 DIAGNOSIS — Z6823 Body mass index (BMI) 23.0-23.9, adult: Secondary | ICD-10-CM

## 2021-02-18 DIAGNOSIS — W19XXXA Unspecified fall, initial encounter: Secondary | ICD-10-CM

## 2021-02-18 DIAGNOSIS — T68XXXA Hypothermia, initial encounter: Secondary | ICD-10-CM

## 2021-02-18 DIAGNOSIS — R569 Unspecified convulsions: Secondary | ICD-10-CM

## 2021-02-18 DIAGNOSIS — Z79899 Other long term (current) drug therapy: Secondary | ICD-10-CM

## 2021-02-18 DIAGNOSIS — Z20822 Contact with and (suspected) exposure to covid-19: Secondary | ICD-10-CM | POA: Diagnosis present

## 2021-02-18 DIAGNOSIS — I452 Bifascicular block: Secondary | ICD-10-CM | POA: Diagnosis present

## 2021-02-18 DIAGNOSIS — Z789 Other specified health status: Secondary | ICD-10-CM

## 2021-02-18 DIAGNOSIS — S0083XA Contusion of other part of head, initial encounter: Secondary | ICD-10-CM | POA: Diagnosis present

## 2021-02-18 DIAGNOSIS — Z515 Encounter for palliative care: Secondary | ICD-10-CM | POA: Diagnosis not present

## 2021-02-18 DIAGNOSIS — D539 Nutritional anemia, unspecified: Secondary | ICD-10-CM | POA: Diagnosis present

## 2021-02-18 DIAGNOSIS — I119 Hypertensive heart disease without heart failure: Secondary | ICD-10-CM | POA: Diagnosis present

## 2021-02-18 DIAGNOSIS — Z833 Family history of diabetes mellitus: Secondary | ICD-10-CM

## 2021-02-18 DIAGNOSIS — W050XXA Fall from non-moving wheelchair, initial encounter: Secondary | ICD-10-CM | POA: Diagnosis present

## 2021-02-18 DIAGNOSIS — E46 Unspecified protein-calorie malnutrition: Secondary | ICD-10-CM | POA: Diagnosis present

## 2021-02-18 DIAGNOSIS — Z66 Do not resuscitate: Secondary | ICD-10-CM | POA: Diagnosis present

## 2021-02-18 DIAGNOSIS — Z96653 Presence of artificial knee joint, bilateral: Secondary | ICD-10-CM | POA: Diagnosis present

## 2021-02-18 DIAGNOSIS — G40909 Epilepsy, unspecified, not intractable, without status epilepticus: Secondary | ICD-10-CM | POA: Diagnosis not present

## 2021-02-18 DIAGNOSIS — E785 Hyperlipidemia, unspecified: Secondary | ICD-10-CM | POA: Diagnosis present

## 2021-02-18 DIAGNOSIS — R1310 Dysphagia, unspecified: Secondary | ICD-10-CM | POA: Diagnosis present

## 2021-02-18 DIAGNOSIS — F02C18 Dementia in other diseases classified elsewhere, severe, with other behavioral disturbance: Secondary | ICD-10-CM | POA: Diagnosis present

## 2021-02-18 DIAGNOSIS — K219 Gastro-esophageal reflux disease without esophagitis: Secondary | ICD-10-CM | POA: Diagnosis present

## 2021-02-18 DIAGNOSIS — G914 Hydrocephalus in diseases classified elsewhere: Secondary | ICD-10-CM | POA: Diagnosis present

## 2021-02-18 DIAGNOSIS — G309 Alzheimer's disease, unspecified: Secondary | ICD-10-CM | POA: Diagnosis present

## 2021-02-18 DIAGNOSIS — Z8711 Personal history of peptic ulcer disease: Secondary | ICD-10-CM

## 2021-02-18 DIAGNOSIS — R64 Cachexia: Secondary | ICD-10-CM | POA: Diagnosis present

## 2021-02-18 LAB — URINALYSIS, ROUTINE W REFLEX MICROSCOPIC
Bilirubin Urine: NEGATIVE
Glucose, UA: NEGATIVE mg/dL
Hgb urine dipstick: NEGATIVE
Ketones, ur: NEGATIVE mg/dL
Leukocytes,Ua: NEGATIVE
Nitrite: NEGATIVE
Protein, ur: NEGATIVE mg/dL
Specific Gravity, Urine: 1.018 (ref 1.005–1.030)
pH: 7 (ref 5.0–8.0)

## 2021-02-18 LAB — CBC WITH DIFFERENTIAL/PLATELET
Abs Immature Granulocytes: 0.1 10*3/uL — ABNORMAL HIGH (ref 0.00–0.07)
Basophils Absolute: 0 10*3/uL (ref 0.0–0.1)
Basophils Relative: 0 %
Eosinophils Absolute: 0 10*3/uL (ref 0.0–0.5)
Eosinophils Relative: 0 %
HCT: 38 % (ref 36.0–46.0)
Hemoglobin: 12.1 g/dL (ref 12.0–15.0)
Immature Granulocytes: 1 %
Lymphocytes Relative: 11 %
Lymphs Abs: 1 10*3/uL (ref 0.7–4.0)
MCH: 32.9 pg (ref 26.0–34.0)
MCHC: 31.8 g/dL (ref 30.0–36.0)
MCV: 103.3 fL — ABNORMAL HIGH (ref 80.0–100.0)
Monocytes Absolute: 0.6 10*3/uL (ref 0.1–1.0)
Monocytes Relative: 6 %
Neutro Abs: 7.5 10*3/uL (ref 1.7–7.7)
Neutrophils Relative %: 82 %
Platelets: 268 10*3/uL (ref 150–400)
RBC: 3.68 MIL/uL — ABNORMAL LOW (ref 3.87–5.11)
RDW: 13.6 % (ref 11.5–15.5)
WBC: 9.2 10*3/uL (ref 4.0–10.5)
nRBC: 0 % (ref 0.0–0.2)

## 2021-02-18 LAB — COMPREHENSIVE METABOLIC PANEL
ALT: 17 U/L (ref 0–44)
AST: 17 U/L (ref 15–41)
Albumin: 3.1 g/dL — ABNORMAL LOW (ref 3.5–5.0)
Alkaline Phosphatase: 93 U/L (ref 38–126)
Anion gap: 8 (ref 5–15)
BUN: 23 mg/dL (ref 8–23)
CO2: 27 mmol/L (ref 22–32)
Calcium: 8.9 mg/dL (ref 8.9–10.3)
Chloride: 105 mmol/L (ref 98–111)
Creatinine, Ser: 0.7 mg/dL (ref 0.44–1.00)
GFR, Estimated: >60 mL/min (ref >60)
Glucose, Bld: 91 mg/dL (ref 70–99)
Potassium: 4.4 mmol/L (ref 3.5–5.1)
Sodium: 140 mmol/L (ref 135–145)
Total Bilirubin: 0.5 mg/dL (ref 0.3–1.2)
Total Protein: 6.9 g/dL (ref 6.5–8.1)

## 2021-02-18 LAB — RAPID URINE DRUG SCREEN, HOSP PERFORMED
Amphetamines: NOT DETECTED
Barbiturates: NOT DETECTED
Benzodiazepines: NOT DETECTED
Cocaine: NOT DETECTED
Opiates: NOT DETECTED
Tetrahydrocannabinol: NOT DETECTED

## 2021-02-18 LAB — RESP PANEL BY RT-PCR (FLU A&B, COVID) ARPGX2
Influenza A by PCR: NEGATIVE
Influenza B by PCR: NEGATIVE
SARS Coronavirus 2 by RT PCR: NEGATIVE

## 2021-02-18 LAB — ETHANOL: Alcohol, Ethyl (B): <10 mg/dL (ref <10)

## 2021-02-18 LAB — CBG MONITORING, ED: Glucose-Capillary: 82 mg/dL (ref 70–99)

## 2021-02-18 LAB — VALPROIC ACID LEVEL: Valproic Acid Lvl: 29 ug/mL — ABNORMAL LOW (ref 50.0–100.0)

## 2021-02-18 MED ORDER — SODIUM CHLORIDE 0.9% FLUSH
3.0000 mL | Freq: Two times a day (BID) | INTRAVENOUS | Status: DC
  Administered 2021-02-18 – 2021-02-23 (×11): 3 mL via INTRAVENOUS

## 2021-02-18 MED ORDER — DIVALPROEX SODIUM 125 MG PO CSDR: 250.0000 mg | DELAYED_RELEASE_CAPSULE | Freq: Three times a day (TID) | ORAL | 0 refills | Status: AC

## 2021-02-18 MED ORDER — DIVALPROEX SODIUM 250 MG PO DR TAB: 250.0000 mg | DELAYED_RELEASE_TABLET | Freq: Three times a day (TID) | ORAL | 0 refills | Status: DC

## 2021-02-18 MED ORDER — LACTATED RINGERS IV SOLN: INTRAVENOUS | Status: AC

## 2021-02-18 MED ORDER — ENOXAPARIN SODIUM 40 MG/0.4ML IJ SOSY
40.0000 mg | PREFILLED_SYRINGE | INTRAMUSCULAR | Status: DC
  Administered 2021-02-18 – 2021-02-19 (×2): 40 mg via SUBCUTANEOUS
  Filled 2021-02-18 (×2): qty 0.4

## 2021-02-18 MED ORDER — LORAZEPAM 2 MG/ML IJ SOLN: 4.0000 mg | INTRAMUSCULAR | Status: DC | PRN

## 2021-02-18 MED ORDER — SODIUM CHLORIDE 0.9 % IV BOLUS
1000.0000 mL | Freq: Once | INTRAVENOUS | Status: AC
  Administered 2021-02-18: 1000 mL via INTRAVENOUS

## 2021-02-18 MED ORDER — ACETAMINOPHEN 650 MG RE SUPP
650.0000 mg | Freq: Four times a day (QID) | RECTAL | Status: DC | PRN
  Administered 2021-02-19 – 2021-02-20 (×2): 650 mg via RECTAL
  Filled 2021-02-18 (×2): qty 1

## 2021-02-18 MED ORDER — ACETAMINOPHEN 325 MG PO TABS: 650.0000 mg | ORAL_TABLET | Freq: Four times a day (QID) | ORAL | Status: DC | PRN

## 2021-02-18 MED ORDER — SODIUM CHLORIDE 0.9 % IV SOLN
1.0000 g | Freq: Once | INTRAVENOUS | Status: AC
  Administered 2021-02-18: 1 g via INTRAVENOUS
  Filled 2021-02-18: qty 10

## 2021-02-18 MED ORDER — DIVALPROEX SODIUM 250 MG PO DR TAB: 1000.0000 mg | DELAYED_RELEASE_TABLET | Freq: Once | ORAL | Status: DC

## 2021-02-18 MED ORDER — LEVETIRACETAM IN NACL 500 MG/100ML IV SOLN
500.0000 mg | Freq: Two times a day (BID) | INTRAVENOUS | Status: DC
  Administered 2021-02-18 – 2021-02-20 (×4): 500 mg via INTRAVENOUS
  Filled 2021-02-18 (×5): qty 100

## 2021-02-18 NOTE — ED Notes (Signed)
Son called and update given will be coming

## 2021-02-18 NOTE — ED Provider Notes (Signed)
[facility] EMERGENCY DEPARTMENT Provider Note   CSN: [id number] Arrival date & time: 02/18/21  3500     History Chief Complaint  Patient presents with   Seizures   Fall    Wanda Conner is a 87 y.o. female.  87 year old female brought in by by EMS from [place] facility for seizure like activity today. History of Alzheimer's dementia, thrombocytopenia, reportedly no history of seizures however was admitted to the hospital in March for seizure. Per EMS, patient was sitting in her wheelchair in the day room when she began shaking and fell to the ground and started foaming at the mouth.  Patient was found by EMS lying on her side, tense but not actively seizing and by time they arrived to the emergency room she had started grunting.  Unsure mental status at baseline.  Was found to have a hematoma to left forehead, placed in c-collar for precautions.      Past Medical History:  Diagnosis Date   Agitation 09/02/2014   Anemia    History of this   Arthritis    Dementia with behavioral disturbance ([place]) 08/21/2014   Depression    Elevated vitamin B12 level 09/02/2014   GERD (gastroesophageal reflux disease)    Hypertension, essential, benign 11/21/2011   Osteoarthritis of left knee 10/31/2011   Status post knee replacement on 10/31/2011.    Palpitations 12/12/2012   Personal history of peptic ulcer disease 11/12/2009   Qualifier: Diagnosis of  By: Mingo Amber MD, Jeff     Seasonal allergies    Urge incontinence 12/29/2010   Uterine prolapse 10/25/2013   Weight loss, unintentional 09/18/2014    Patient Active Problem List   Diagnosis Date Noted   Protein-calorie malnutrition, mild ([place])    Seizure ([place]) 10/08/2020   Hypernatremia 08/06/2020   Alzheimer's dementia with behavioral disturbance ([place]) 08/06/2020   Thrombocytopenia ([place]) 08/06/2020   Lactic acidosis 93/81/8299   Acute metabolic encephalopathy 37/16/9678   Open wound of foot except toe(s) alone  11/20/2014   Weight loss, unintentional 09/18/2014   Dementia without behavioral disturbance ([place]) 08/21/2014   Uterine prolapse 10/25/2013   Bony growth 12/12/2012   Hypertension, essential, benign 11/21/2011   Osteoarthritis of left knee 10/31/2011   Preventative health care 12/29/2010   Urge incontinence 12/29/2010    Past Surgical History:  Procedure Laterality Date   BREAST SURGERY     cyst removal   JOINT REPLACEMENT     Right kee replacement 2009   TOTAL KNEE ARTHROPLASTY  10/31/2011   Procedure: TOTAL KNEE ARTHROPLASTY;  Surgeon: Kerin Salen, MD;  Location: [facility];  Service: Orthopedics;  Laterality: Left;     OB History   No obstetric history on file.     Family History  Problem Relation Age of Onset   Diabetes Father    Diabetes Brother     Social History   Tobacco Use   Smoking status: Never   Smokeless tobacco: Never  Substance Use Topics   Alcohol use: No   Drug use: No    Home Medications Prior to Admission medications   Medication Sig Start Date End Date Taking? Authorizing Provider  acetaminophen (TYLENOL) 500 MG tablet Take 500 mg by mouth every 4 (four) hours as needed for headache, mild pain or fever (CANNOT EXCEED 2,000 MG/24 HOURS).   Yes [provider]  amLODipine (NORVASC) 5 MG tablet Take 5 mg by mouth daily.   Yes [provider]  Cholecalciferol (  VITAMIN D3 SUPER STRENGTH) 50 MCG (2000 UT) CAPS Take 2,000 Units by mouth daily.   Yes [provider]  divalproex (DEPAKOTE) 250 MG DR tablet Take 1 tablet (250 mg total) by mouth 3 (three) times daily. 02/18/21 03/20/21 Yes Tacy Learn, PA-C  donepezil (ARICEPT) 10 MG tablet Take 10 mg by mouth every evening.   Yes [provider]  guaifenesin (ROBITUSSIN) 100 MG/5ML syrup Take 200 mg by mouth every 6 (six) hours as needed for cough.   Yes [provider]  loperamide (IMODIUM) 2 MG capsule Take 2 mg by mouth as needed for diarrhea or loose stools  (MAX OF 8 DOSES/24 HOURS).   Yes [provider]  losartan (COZAAR) 100 MG tablet Take 100 mg by mouth daily.   Yes [provider]  magnesium hydroxide (MILK OF MAGNESIA) 400 MG/5ML suspension Take 30 mLs by mouth at bedtime as needed for mild constipation.   Yes [provider]  Rose 200-200-20 MG/5ML suspension Take 30 mLs by mouth every 6 (six) hours as needed for indigestion or heartburn (CANNOT EXCEED 4 DOSES/24 HOURS).   Yes [provider]  Neomycin-Bacitracin-Polymyxin (TRIPLE ANTIBIOTIC FIRST AID) [phone] OINT Apply 1 application topically daily as needed (minor skin tears or abrasions- clean with normal saline first and bandage afterwards).   Yes [provider]  OVER THE COUNTER MEDICATION Take 1 Bottle by mouth in the morning, at noon, and at bedtime. Mighty Shakes Liquid   Yes [provider]  sertraline (ZOLOFT) 25 MG tablet Take 25 mg by mouth daily.   Yes [provider]  divalproex (DEPAKOTE SPRINKLE) 125 MG capsule Take 2 capsules (250 mg total) by mouth 3 (three) times daily. 02/18/21 03/20/21  Tacy Learn, PA-C    Allergies    Omeprazole  Review of Systems   Review of Systems  Unable to perform ROS: Dementia   Physical Exam Updated Vital Signs BP (!) 156/56 (BP Location: Right Arm)   Pulse 72   Temp (!) 96.9 F (36.1 C) (Bladder)   Resp 17   Ht 5\' 4"  (1.626 m)   Wt 61.2 kg   SpO2 97%   BMI 23.16 kg/m   Physical Exam Vitals and nursing note reviewed.  Constitutional:      General: She is not in acute distress.    Appearance: She is well-developed. She is not diaphoretic.     Comments: awake  HENT:     Head: Normocephalic.     Comments: Hematoma left forehead    Nose: Nose normal.     Mouth/Throat:     Mouth: Mucous membranes are moist.  Eyes:     Pupils: Pupils are equal, round, and reactive to light.  Neck:     Comments: C-collar in place Cardiovascular:     Rate and Rhythm:  Normal rate and regular rhythm.     Pulses: Normal pulses.     Heart sounds: Normal heart sounds.  Pulmonary:     Effort: Pulmonary effort is normal.     Breath sounds: Normal breath sounds.  Abdominal:     Palpations: Abdomen is soft.     Tenderness: There is no abdominal tenderness.  Musculoskeletal:        General: No swelling, tenderness or deformity.     Right lower leg: No edema.     Left lower leg: No edema.  Skin:    General: Skin is warm and dry.     Findings: No erythema or  rash.  Neurological:     Comments: Opens eyes when her name is called, will make eye contact, visually track. Moves all 4 extremities     ED Results / Procedures / Treatments   Labs (all labs ordered are listed, but only abnormal results are displayed) Labs Reviewed  COMPREHENSIVE METABOLIC PANEL - Abnormal; Notable for the following components:      Result Value   Albumin 3.1 (*)    All other components within normal limits  CBC WITH DIFFERENTIAL/PLATELET - Abnormal; Notable for the following components:   RBC 3.68 (*)    MCV 103.3 (*)    Abs Immature Granulocytes 0.10 (*)    All other components within normal limits  VALPROIC ACID LEVEL - Abnormal; Notable for the following components:   Valproic Acid Lvl 29 (*)    All other components within normal limits  CULTURE, BLOOD (ROUTINE X 2)  CULTURE, BLOOD (ROUTINE X 2)  URINE CULTURE  RESP PANEL BY RT-PCR (FLU A&B, COVID) ARPGX2  URINALYSIS, ROUTINE W REFLEX MICROSCOPIC  ETHANOL  RAPID URINE DRUG SCREEN, HOSP PERFORMED  LACTIC ACID, PLASMA  LACTIC ACID, PLASMA  CBG MONITORING, ED    EKG None  Radiology CT Head Wo Contrast  Result Date: 02/18/2021 CLINICAL DATA:  87 year old female status post seizure and fall. EXAM: CT HEAD WITHOUT CONTRAST TECHNIQUE: Contiguous axial images were obtained from the base of the skull through the vertex without intravenous contrast. COMPARISON:  Brain MRI and head CT 10/08/2020. FINDINGS: Brain: Stable  cerebral volume. Chronic ventriculomegaly. No midline shift, mass effect, or evidence of intracranial mass lesion. Chronic encephalomalacia lateral right perirolandic cortex. Stable confluent bilateral white matter hypodensity. Small chronic right cerebellar infarct is stable (coronal image 55). No cortically based acute infarct identified. No acute intracranial hemorrhage identified. Stable basal ganglia vascular calcifications. Vascular: Calcified atherosclerosis at the skull base. No suspicious intracranial vascular hyperdensity. Skull: Stable and intact. Sinuses/Orbits: Visualized paranasal sinuses and mastoids are stable and well aerated. Other: Mild left forehead scalp soft tissue scarring. No acute orbit or scalp soft tissue injury. IMPRESSION: 1. No acute intracranial abnormality or acute traumatic injury identified. 2. Stable non contrast CT appearance of the brain with chronic ischemic disease and ventriculomegaly. Electronically Signed   By: Genevie Ann M.D.   On: 02/18/2021 08:02   CT Cervical Spine Wo Contrast  Result Date: 02/18/2021 CLINICAL DATA:  87 year old female status post seizure and fall. EXAM: CT CERVICAL SPINE WITHOUT CONTRAST TECHNIQUE: Multidetector CT imaging of the cervical spine was performed without intravenous contrast. Multiplanar CT image reconstructions were also generated. COMPARISON:  Head CT today, and earlier. Brain MRI 10/08/2020. Chest CT 12/19/2012. FINDINGS: Alignment: Preserved cervical lordosis. Cervicothoracic junction alignment is within normal limits. Bilateral posterior element alignment is within normal limits. There is mild degenerative appearing anterolisthesis of C6 on C7, with associated left side facet hypertrophy. Skull base and vertebrae: Visualized skull base is intact. No atlanto-occipital dissociation. C1 and C2 appear aligned and intact. No acute osseous abnormality identified. Soft tissues and spinal canal: No prevertebral fluid or swelling. No visible  canal hematoma. Negative visible noncontrast neck soft tissues aside from calcified carotid atherosclerosis. Disc levels: Widespread cervical facet hypertrophy and disc bulging but generally mild for age cervical spine degeneration. No convincing spinal stenosis. Upper chest: Mild compression of the T1 superior endplate is new since 2440, but appears to be chronic along with a chronic fracture at the head of the right 1st rib. Other visible upper thoracic levels appear  grossly intact. Calcified aortic atherosclerosis. Negative lung apices. IMPRESSION: 1. No acute traumatic injury identified in the cervical spine. 2. Mild T1 superior endplate compression fracture is new since 2014, but appears to be chronic. 3. Mild for age cervical spine degeneration. 4. Aortic Atherosclerosis (ICD10-I70.0). Electronically Signed   By: Genevie Ann M.D.   On: 02/18/2021 08:09    Procedures Procedures   Medications Ordered in ED Medications  LORazepam (ATIVAN) injection 4 mg (has no administration in time range)  divalproex (DEPAKOTE) DR tablet 1,000 mg (has no administration in time range)  cefTRIAXone (ROCEPHIN) 1 g in sodium chloride 0.9 % 100 mL IVPB (1 g Intravenous New Bag/Given 02/18/21 1242)  enoxaparin (LOVENOX) injection 40 mg (has no administration in time range)  sodium chloride flush (NS) 0.9 % injection 3 mL (has no administration in time range)  acetaminophen (TYLENOL) tablet 650 mg (has no administration in time range)    Or  acetaminophen (TYLENOL) suppository 650 mg (has no administration in time range)  sodium chloride 0.9 % bolus 1,000 mL (1,000 mLs Intravenous New Bag/Given 02/18/21 1235)    ED Course  I have reviewed the triage vital signs and the nursing notes.  Pertinent labs & imaging results that were available during my care of the patient were reviewed by me and considered in my medical decision making (see chart for details).  Clinical Course as of 02/18/21 1243  Thu Feb 19, 2372  2925  87 year old female brought in by EMS from nursing facility for seizure.  History of prior seizures, recently admitted earlier this year, thought to be secondary to worsening Alzheimer's.  Patient was started on Depakote. On arrival, patient will make eye contact when her name is called, otherwise not answering questions, not following commands.  CBC without significant findings, hemoglobin normal, white blood cell count normal.  CMP without significant electrolyte derangement, creatinine reassuring at 0.7.  Valproic acid level subtherapeutic at 29.  CT head and C-spine obtained due to seizure and fall, no acute findings. Case discussed with Dr. Leonel Ramsay with neurology, recommends oral load of 1 g, increase patient's daily Depakote level to 250 mg 3 times daily.  Patient is found to be hypothermic with a rectal temp of 94, placed under Bair hugger, on recheck temp is now 94.6.  Patient's son and medical power of attorney [place], has arrived at bedside.  States that patient at baseline will say yes when asked some questions such as do you want a cookie.  States that she is not answering his questions at this time which is different for her.  States that she normally sleeps a lot.  Discussed available results and concern for hypothermia.  Patient's son would like her admitted to the hospital for treatment and monitoring.  Patient does not have a formal DNR however patient's son states he would not want her to have chest compressions or to be intubated.  We will add on blood cultures and lactic acid with plan to consult for admission.  Case discussed with Dr. Roslynn Amble, ER attending, agrees with plan of care.   [LM]  5329 Case discussed with internal medicine service will consult for admission.  Patient was given Rocephin for her hypothermia with concern for unidentified infectious source.  Urinalysis is unremarkable, no UTI.  Lactic acid pending as well as blood cultures. [LM]    Clinical Course  User Index [LM] Roque Lias   MDM Rules/Calculators/A&P  Final Clinical Impression(s) / ED Diagnoses Final diagnoses:  Hypothermia, initial encounter  Seizure ([place])  Fall, initial encounter    Rx / DC Orders ED Discharge Orders          Ordered    divalproex (DEPAKOTE) 250 MG DR tablet  3 times daily        02/18/21 1041    divalproex (DEPAKOTE SPRINKLE) 125 MG capsule  3 times daily        02/18/21 1041             Roque Lias 02/18/21 1243    Ezequiel Essex, MD 02/19/21 [phone]

## 2021-02-18 NOTE — Procedures (Signed)
Patient Name: Wanda Conner  MRN: [id number]  Epilepsy Attending: Lora Havens  Referring Physician/Provider: Dr Harvie Heck Date: 02/18/2021 Duration: 23.24 mins  Patient history: 87 year old female who presented from her SNF for evaluation of seizure-like activity with shaking, fall out of wheelchair, foaming at the mouth and without return to baseline mental status. EEG to evaluate for seizure  Level of alertness: Awake, asleep  AEDs during EEG study: None  Technical aspects: This EEG study was done with scalp electrodes positioned according to the 10-20 International system of electrode placement. Electrical activity was acquired at a sampling rate of 500Hz  and reviewed with a high frequency filter of 70Hz  and a low frequency filter of 1Hz . EEG data were recorded continuously and digitally stored.   Description: No posterior dominant rhythm was seen. Sleep was characterized by sleep spindles (12 to 14 Hz), maximal frontocentral region.  EEG showed continuous generalized 3 to 6 Hz theta-delta slowing. Hyperventilation and photic stimulation were not performed.     ABNORMALITY - Continuous slow, generalized  IMPRESSION: This study is suggestive of moderate diffuse encephalopathy, nonspecific etiology. No seizures or definite epileptiform discharges were seen throughout the recording.  [name]

## 2021-02-18 NOTE — ED Notes (Signed)
Neuro check unchanged each assessment , will not follow commands is normally non-verbal and non-ambulatory

## 2021-02-18 NOTE — Progress Notes (Signed)
EEG complete - results pending 

## 2021-02-18 NOTE — ED Triage Notes (Signed)
Per [facility] staff, patient has a seizure, no seizure history.  Patient was postictal with EMS, drooling, no incontinence.  Patient is not at her baseline upon arrival to ED.  Patient does have dementia, she is grunting with EMS.  No vomiting.

## 2021-02-18 NOTE — ED Notes (Signed)
Called and spoke with staff at [facility] , states patient was found on the floor shaking, patient is normally non-verbal and non-ambulatory will not follow commands.

## 2021-02-18 NOTE — Consult Note (Signed)
Consultation Note Date: 02/18/2021   Patient Name: Wanda Conner  DOB: 05/31/1933  MRN: [id number]  Age / Sex: 87 y.o., female  PCP: Patient, [name] (Inactive) Referring Physician: Sid Falcon, MD  Reason for Consultation: Establishing goals of care, "goals of care given advanced dementia and seizures"  HPI/Patient Profile: 87 y.o. female  with past medical history of advanced Alzheimer's dementia with behavioral disturbance, hypertension, thrombocytopenia, hyperlipidemia, depression and hydrocephalus who presents to [facility] ED on 02/18/21 from [place] after witnessed seizure-like activity. Patient was admitted May 19-20, 2022 for seizure-like activity also. Patient was admitted on 02/18/2021 with breakthrough seizure, advanced dementia, protein calorie malnutrition.   Clinical Assessment and Goals of Care: I have reviewed medical records including EPIC notes, labs, and imaging. Received report from primary RN - no acute concerns. RN verifies patient is nonverbal and non-ambulatory at baseline.  Went to visit patient at bedside - no family/visitors present. Patient was lying in bed - she does not wake to voice/gentle touch. No signs or non-verbal gestures of pain or discomfort noted. No respiratory distress, increased work of breathing, or secretions noted.   Met with son/Herman by phone  to discuss diagnosis, prognosis, GOC, EOL wishes, disposition, and options.  I introduced Palliative Medicine as specialized medical care for people living with serious illness. It focuses on providing relief from the symptoms and stress of a serious illness. The goal is to improve quality of life for both the patient and the family.  [place] is not able to complete full [place] tonight - requests full [place] conversation tomorrow 9/30 at 4pm via phone.  Therapeutic listening and emotional support provided.   All  questions and concerns addressed. Encouraged to call with questions and/or concerns. PMT number provided.   Primary Decision Maker: NEXT OF KIN - son/Herman Garbutt    SUMMARY OF RECOMMENDATIONS   Continue current medical treatment  Unable to complete full GOC with son today. Scheduled meeting via phone for full [place] tomorrow 9/30 at 4pm with son Continue DNR/DNI as previously documented PMT will continue to follow and support holistically   Code Status/Advance Care Planning: DNR  Palliative Prophylaxis:  Aspiration, Bowel Regimen, Delirium Protocol, Frequent Pain Assessment, Oral Care, and Turn Reposition  Additional Recommendations (Limitations, Scope, Preferences): Full Scope Treatment and No Tracheostomy  Psycho-social/Spiritual:  Desire for further Chaplaincy support:no Created space and opportunity for patient and family to express thoughts and feelings regarding patient's current medical situation.  Emotional support and therapeutic listening provided.  Prognosis:  Unable to determine  Discharge Planning: To Be Determined      Primary Diagnoses: Present on Admission: **None**   I have reviewed the medical record, interviewed the patient and family, and examined the patient. The following aspects are pertinent.  Past Medical History:  Diagnosis Date   Agitation 09/02/2014   Anemia    History of this   Arthritis    Dementia with behavioral disturbance ([place]) 08/21/2014   Depression    Elevated vitamin B12 level 09/02/2014  GERD (gastroesophageal reflux disease)    Hypertension, essential, benign 11/21/2011   Osteoarthritis of left knee 10/31/2011   Status post knee replacement on 10/31/2011.    Palpitations 12/12/2012   Personal history of peptic ulcer disease 11/12/2009   Qualifier: Diagnosis of  By: Mingo  MD, Jeff     Seasonal allergies    Urge incontinence 12/29/2010   Uterine prolapse 10/25/2013   Weight loss, unintentional 09/18/2014   Social History    Socioeconomic History   Marital status: Widowed    Spouse name: Not on file   Number of children: 2   Years of education: 44   Highest education level: Not on file  Occupational History   Not on file  Tobacco Use   Smoking status: Never   Smokeless tobacco: Never  Substance and Sexual Activity   Alcohol use: No   Drug use: No   Sexual activity: Never  Other Topics Concern   Not on file  Social History Narrative   Patient very religious, this informs much of her decision making.  Also very leery of Westernized medicine, refuses all but a few medications.  Declines all screening and preventative health at all.     Social Determinants of Health   Financial Resource Strain: Not on file  Food Insecurity: Not on file  Transportation Needs: Not on file  Physical Activity: Not on file  Stress: Not on file  Social Connections: Not on file   Family History  Problem Relation Age of Onset   Diabetes Father    Diabetes Brother    Scheduled Meds:  enoxaparin (LOVENOX) injection  40 mg Subcutaneous Q24H   sodium chloride flush  3 mL Intravenous Q12H   Continuous Infusions: PRN Meds:.acetaminophen **OR** acetaminophen, LORazepam Medications Prior to Admission:  Prior to Admission medications   Medication Sig Start Date End Date Taking? Authorizing Provider  acetaminophen (TYLENOL) 500 MG tablet Take 500 mg by mouth every 4 (four) hours as needed for headache, mild pain or fever (CANNOT EXCEED 2,000 MG/24 HOURS).   Yes [provider]  amLODipine (NORVASC) 5 MG tablet Take 5 mg by mouth daily.   Yes [provider]  Cholecalciferol (VITAMIN D3 SUPER STRENGTH) 50 MCG (2000 UT) CAPS Take 2,000 Units by mouth daily.   Yes [provider]  divalproex (DEPAKOTE) 250 MG DR tablet Take 1 tablet (250 mg total) by mouth 3 (three) times daily. 02/18/21 03/20/21 Yes Tacy Learn, PA-C  donepezil (ARICEPT) 10 MG tablet Take 10 mg by mouth every evening.   Yes  [provider]  guaifenesin (ROBITUSSIN) 100 MG/5ML syrup Take 200 mg by mouth every 6 (six) hours as needed for cough.   Yes [provider]  loperamide (IMODIUM) 2 MG capsule Take 2 mg by mouth as needed for diarrhea or loose stools (MAX OF 8 DOSES/24 HOURS).   Yes [provider]  losartan (COZAAR) 100 MG tablet Take 100 mg by mouth daily.   Yes [provider]  magnesium hydroxide (MILK OF MAGNESIA) 400 MG/5ML suspension Take 30 mLs by mouth at bedtime as needed for mild constipation.   Yes [provider]  [place] 200-200-20 MG/5ML suspension Take 30 mLs by mouth every 6 (six) hours as needed for indigestion or heartburn (CANNOT EXCEED 4 DOSES/24 HOURS).   Yes [provider]  Neomycin-Bacitracin-Polymyxin (TRIPLE ANTIBIOTIC FIRST AID) [phone] OINT Apply 1 application topically daily as needed (minor skin tears or abrasions- clean with normal saline first and bandage afterwards).  Yes [provider]  OVER THE COUNTER MEDICATION Take 1 Bottle by mouth in the morning, at noon, and at bedtime. Mighty Shakes Liquid   Yes [provider]  sertraline (ZOLOFT) 25 MG tablet Take 25 mg by mouth daily.   Yes [provider]  divalproex (DEPAKOTE SPRINKLE) 125 MG capsule Take 2 capsules (250 mg total) by mouth 3 (three) times daily. 02/18/21 03/20/21  Tacy Learn, PA-C   Allergies  Allergen Reactions   Omeprazole Other (See Comments)    "It did me worse than it did good." Pt does not like this medication to treat her stomach symptoms.   Review of Systems  Unable to perform ROS: Dementia   Physical Exam Vitals and nursing note reviewed.  Constitutional:      General: She is not in acute distress. Pulmonary:     Effort: No respiratory distress.  Skin:    General: Skin is warm and dry.  Neurological:     Mental Status: She is unresponsive.     Motor: Weakness present.  Psychiatric:        Speech: She is  noncommunicative.    Vital Signs: BP (!) 156/56 (BP Location: Right Arm)   Pulse 81   Temp 98.8 F (37.1 C)   Resp 17   Ht _0  (1.626 m)   Wt 61.2 kg   SpO2 98%   BMI 23.16 kg/m  Pain Scale: Faces   Pain Score: 6    SpO2: SpO2: 98 % O2 Device:SpO2: 98 % O2 Flow Rate: .   IO: Intake/output summary: No intake or output data in the 24 hours ending 02/18/21 1637  LBM:   Baseline Weight: Weight: 61.2 kg Most recent weight: Weight: 61.2 kg     Palliative Assessment/Data: PPS 20-30%     Time In: 1840 Time Out: 1715 Time Total: 35 minutes  Greater than 50%  of this time was spent counseling and coordinating care related to the above assessment and plan.  Signed by: Lin Landsman, NP   Please contact Palliative Medicine Team phone at [phone] for questions and concerns.  For individual provider: See Shea Evans

## 2021-02-18 NOTE — H&P (Addendum)
Date: 02/18/2021               Patient Name:  Wanda Conner MRN: [id number]  DOB: [birth year] Age / Sex: 87 y.o., female   PCP: Patient, [name] (Inactive)         Medical Service: Internal Medicine Teaching Service         Attending Physician: Dr. Sid Falcon, MD    First Contact: Farrel Gordon, DO Pager: ED [phone]  Second Contact: Linwood Dibbles, MD Pager: PA [phone]       After Hours (After 5p/  First Contact Pager: [phone]  weekends / holidays): Second Contact Pager: [phone]   SUBJECTIVE  Chief Complaint: altered mental status, seizure  History of Present Illness: Wanda Conner is a 87 y.o. female with a pertinent PMH of advanced Alzheimer's dementia with behavioral disturbance, hypertension, hyperlipidemia, depression and hydrocephalus who presents to [facility] after witnessed seizure-like activity. History obtained by chart review, patient's son at bedside and nursing facility. Patient is nonverbal and nonambulatory at baseline.   Wanda Conner presented from [facility] for concerns of seizure-like activity this morning. Per EMS run sheet, patient was in her usual state of health on waking this morning. However, began to shake while sitting in her wheelchair and fell to the floor. She did hit her head and was noted to be drooling on EMS arrival. However, no longer shaking. No medications were administered. On discussion with the nursing staff at her facility, patient did not have any recent fevers/chills. She is nonverbal and nonambulatory at baseline and was noted to be in usual state of health. No missed depakote dosings at her facility.   Of note, patient admitted on 5/19-5/20 for witnessed seizure for which she was given versed for termination. She was noted to have increased hydrocephalus on CT compared to 4/22 but stable from MRI on 3/22. Workup negative for infection, TSH, electrolyte imbalances, B12 or folate levels. Spot EEG wnl. Cause of her seizures was thought  to be secondary to progression of her hydrocephalus secondary to Alzheimer's dementia for which she was on depakote twice daily.   In the ED, the patient was noted to be hypothermic to 34C on arrival, slightly hypertensive with SBP in 140s, HR 60's and saturating well on room air. Bearhugger was placed with improvement in temperature. CBC without leukocytosis and no significant electrolyte abnormalities noted on CMP. Urinalysis negative for UTI. Utox wnl. Valproic acid level subtherapeutic at 29. CT Head and spine without acute  intracranial abnormality or acute traumatic injury identified.Neurology consulted and patient admitted to internal medicine for further management.   Medications: No current facility-administered medications on file prior to encounter.   Current Outpatient Medications on File Prior to Encounter  Medication Sig Dispense Refill   acetaminophen (TYLENOL) 500 MG tablet Take 500 mg by mouth every 4 (four) hours as needed for headache, mild pain or fever (CANNOT EXCEED 2,000 MG/24 HOURS).     amLODipine (NORVASC) 5 MG tablet Take 5 mg by mouth daily.     Cholecalciferol (VITAMIN D3 SUPER STRENGTH) 50 MCG (2000 UT) CAPS Take 2,000 Units by mouth daily.     donepezil (ARICEPT) 10 MG tablet Take 10 mg by mouth every evening.     guaifenesin (ROBITUSSIN) 100 MG/5ML syrup Take 200 mg by mouth every 6 (six) hours as needed for cough.     loperamide (IMODIUM) 2 MG capsule Take 2 mg by mouth as needed for diarrhea or loose stools (  MAX OF 8 DOSES/24 HOURS).     losartan (COZAAR) 100 MG tablet Take 100 mg by mouth daily.     magnesium hydroxide (MILK OF MAGNESIA) 400 MG/5ML suspension Take 30 mLs by mouth at bedtime as needed for mild constipation.     [place] 034-917-91 MG/5ML suspension Take 30 mLs by mouth every 6 (six) hours as needed for indigestion or heartburn (CANNOT EXCEED 4 DOSES/24 HOURS).     Neomycin-Bacitracin-Polymyxin (TRIPLE ANTIBIOTIC FIRST AID) [phone] OINT Apply 1  application topically daily as needed (minor skin tears or abrasions- clean with normal saline first and bandage afterwards).     OVER THE COUNTER MEDICATION Take 1 Bottle by mouth in the morning, at noon, and at bedtime. Mighty Shakes Liquid     sertraline (ZOLOFT) 25 MG tablet Take 25 mg by mouth daily.      Past Medical History:  Past Medical History:  Diagnosis Date   Agitation 09/02/2014   Anemia    History of this   Arthritis    Dementia with behavioral disturbance ([place]) 08/21/2014   Depression    Elevated vitamin B12 level 09/02/2014   GERD (gastroesophageal reflux disease)    Hypertension, essential, benign 11/21/2011   Osteoarthritis of left knee 10/31/2011   Status post knee replacement on 10/31/2011.    Palpitations 12/12/2012   Personal history of peptic ulcer disease 11/12/2009   Qualifier: Diagnosis of  By: Mingo Amber MD, Jeff     Seasonal allergies    Urge incontinence 12/29/2010   Uterine prolapse 10/25/2013   Weight loss, unintentional 09/18/2014    Social:  Patient [facility] at [facility]. She is nonverbal and nonambulatory. She is dependent for all ADL's. Husband passed away in 08/27/2014 after which her dementia progressed more rapidly. No history of tobacco use, alcohol use or illicit substance use.   Family History: Family History  Problem Relation Age of Onset   Diabetes Father    Diabetes Brother     Allergies: Allergies as of 02/18/2021 - Review Complete 02/18/2021  Allergen Reaction Noted   Omeprazole Other (See Comments) 10/18/2011    Review of Systems: A complete ROS was negative except as per HPI.   OBJECTIVE:  Physical Exam: Blood pressure (!) 156/56, pulse 81, temperature 98.8 F (37.1 C), resp. rate 17, height 5\' 4"  (1.626 m), weight 61.2 kg, SpO2 98 %. Physical Exam  Constitutional: chronically ill appearing female, resting comfortable in bed, no acute distress HENT: Normocephalic with left forehead hematoma, conjunctiva normal, dry mucous  membranes Cardiovascular: Normal rate, regular rhythm, S1 and S2 present, no murmurs, rubs, gallops.  Distal pulses intact Respiratory: No respiratory distress,  Lungs are clear to auscultation bilaterally. GI: Nondistended, soft, no guarding or rebound tenderness, normal bowel sounds Musculoskeletal: Normal bulk and tone.  No peripheral edema noted. Neurological: Is somnolent but arousable to voice; responds to voice but with nonintelligible speech; does not follow commands; PERRL; tracking through visual fields; contracted extremities but is spontaneously moving bilateral upper extremities; unable to assess coordination Skin: Warm and dry.  No rash, erythema, lesions noted.  Pertinent Labs: CBC    Component Value Date/Time   WBC 9.2 02/18/2021 0837   RBC 3.68 (L) 02/18/2021 0837   HGB 12.1 02/18/2021 0837   HCT 38.0 02/18/2021 0837   PLT 268 02/18/2021 0837   MCV 103.3 (H) 02/18/2021 0837   MCH 32.9 02/18/2021 0837   MCHC 31.8 02/18/2021 0837   RDW 13.6 02/18/2021 0837   LYMPHSABS 1.0 02/18/2021 5056  MONOABS 0.6 02/18/2021 0837   EOSABS 0.0 02/18/2021 0837   BASOSABS 0.0 02/18/2021 0837     CMP     Component Value Date/Time   NA 140 02/18/2021 0837   K 4.4 02/18/2021 0837   CL 105 02/18/2021 0837   CO2 27 02/18/2021 0837   GLUCOSE 91 02/18/2021 0837   BUN 23 02/18/2021 0837   CREATININE 0.70 02/18/2021 0837   CREATININE 0.59 08/27/2013 1549   CALCIUM 8.9 02/18/2021 0837   PROT 6.9 02/18/2021 0837   ALBUMIN 3.1 (L) 02/18/2021 0837   AST 17 02/18/2021 0837   ALT 17 02/18/2021 0837   ALKPHOS 93 02/18/2021 0837   BILITOT 0.5 02/18/2021 0837   GFRNONAA >60 02/18/2021 0837   GFRNONAA 89 08/27/2013 1549   GFRAA >89 08/27/2013 1549    Pertinent Imaging: CT Head Wo Contrast  Result Date: 02/18/2021 CLINICAL DATA:  87 year old female status post seizure and fall. EXAM: CT HEAD WITHOUT CONTRAST TECHNIQUE: Contiguous axial images were obtained from the base of the skull  through the vertex without intravenous contrast. COMPARISON:  Brain MRI and head CT 10/08/2020. FINDINGS: Brain: Stable cerebral volume. Chronic ventriculomegaly. No midline shift, mass effect, or evidence of intracranial mass lesion. Chronic encephalomalacia lateral right perirolandic cortex. Stable confluent bilateral white matter hypodensity. Small chronic right cerebellar infarct is stable (coronal image 55). No cortically based acute infarct identified. No acute intracranial hemorrhage identified. Stable basal ganglia vascular calcifications. Vascular: Calcified atherosclerosis at the skull base. No suspicious intracranial vascular hyperdensity. Skull: Stable and intact. Sinuses/Orbits: Visualized paranasal sinuses and mastoids are stable and well aerated. Other: Mild left forehead scalp soft tissue scarring. No acute orbit or scalp soft tissue injury. IMPRESSION: 1. No acute intracranial abnormality or acute traumatic injury identified. 2. Stable non contrast CT appearance of the brain with chronic ischemic disease and ventriculomegaly. Electronically Signed   By: Genevie Ann M.D.   On: 02/18/2021 08:02   CT Cervical Spine Wo Contrast  Result Date: 02/18/2021 CLINICAL DATA:  87 year old female status post seizure and fall. EXAM: CT CERVICAL SPINE WITHOUT CONTRAST TECHNIQUE: Multidetector CT imaging of the cervical spine was performed without intravenous contrast. Multiplanar CT image reconstructions were also generated. COMPARISON:  Head CT today, and earlier. Brain MRI 10/08/2020. Chest CT 12/19/2012. FINDINGS: Alignment: Preserved cervical lordosis. Cervicothoracic junction alignment is within normal limits. Bilateral posterior element alignment is within normal limits. There is mild degenerative appearing anterolisthesis of C6 on C7, with associated left side facet hypertrophy. Skull base and vertebrae: Visualized skull base is intact. No atlanto-occipital dissociation. C1 and C2 appear aligned and intact.  No acute osseous abnormality identified. Soft tissues and spinal canal: No prevertebral fluid or swelling. No visible canal hematoma. Negative visible noncontrast neck soft tissues aside from calcified carotid atherosclerosis. Disc levels: Widespread cervical facet hypertrophy and disc bulging but generally mild for age cervical spine degeneration. No convincing spinal stenosis. Upper chest: Mild compression of the T1 superior endplate is new since 8250, but appears to be chronic along with a chronic fracture at the head of the right 1st rib. Other visible upper thoracic levels appear grossly intact. Calcified aortic atherosclerosis. Negative lung apices. IMPRESSION: 1. No acute traumatic injury identified in the cervical spine. 2. Mild T1 superior endplate compression fracture is new since 2014, but appears to be chronic. 3. Mild for age cervical spine degeneration. 4. Aortic Atherosclerosis (ICD10-I70.0). Electronically Signed   By: Genevie Ann M.D.   On: 02/18/2021 08:09   EEG adult  Result Date: 02/18/2021 Lora Havens, MD     02/18/2021  3:34 PM Patient Name: Wanda Conner MRN: [id number] Epilepsy Attending: Lora Havens Referring Physician/Provider: Dr Harvie Heck Date: 02/18/2021 Duration: 23.24 mins Patient history: 87 year old female who presented from her SNF for evaluation of seizure-like activity with shaking, fall out of wheelchair, foaming at the mouth and without return to baseline mental status. EEG to evaluate for seizure Level of alertness: Awake, asleep AEDs during EEG study: None Technical aspects: This EEG study was done with scalp electrodes positioned according to the 10-20 International system of electrode placement. Electrical activity was acquired at a sampling rate of 500Hz  and reviewed with a high frequency filter of 70Hz  and a low frequency filter of 1Hz . EEG data were recorded continuously and digitally stored. Description: No posterior dominant rhythm was seen. Sleep was  characterized by sleep spindles (12 to 14 Hz), maximal frontocentral region.  EEG showed continuous generalized 3 to 6 Hz theta-delta slowing. Hyperventilation and photic stimulation were not performed.   ABNORMALITY - Continuous slow, generalized IMPRESSION: This study is suggestive of moderate diffuse encephalopathy, nonspecific etiology. No seizures or definite epileptiform discharges were seen throughout the recording. Wanda Conner    EKG: personally reviewed my interpretation is unchanged from previous tracings, normal sinus rhythm, RBBB, LAFB  ASSESSMENT & PLAN:  Assessment: Active Problems:   Seizure ([place])   Wanda Conner is a 87 y.o. with pertinent PMH of dvanced Alzheimer's dementia with behavioral disturbance, hypertension, hyperlipidemia, depression and hydrocephalus who presented with seizure-like activity and admit for breakthrough seizures on hospital day 0  Plan: #Breakthrough Seizure #Advanced Alzheimer dementia Patient with prior history of seizures with recent admission 5/19-5/20 for seizure secondary to advanced Alzheimer's dementia presented with seizure like activity. No medication required for seizure termination. She appears back to baseline per family member at bedside. She was hypothermic on presentation, given concern for possible infectious etiology. However, CBC wnl. CMP without electrolyte abnormalities noted. Urinalysis without evidence of UTI. CT Head and cervical spine without evidence of acute intracranial abnormality or traumatic injury. Noted to have valproic acid levels subtherapeutic at 29.  - Neurology consulted, appreciate recommendations - CXR to evaluate for PNA - IV Keppra 500mg  bid, transition to PO depakote once tolerating oral intake  - MRI Brain wo Contrast  - Seizure precautions - EEG pending  - Given advanced alzheimer's disease and progression of her disease, will consult palliative care for goals of care discussions with patient's  son  #Protein calorie malnutrition Patient appears cachectic on exam and labs with decreased albumin reflecting protein calorie malnutrition. Mucous membranes dry on exam. Will have patient evaluted by SLP prior to initiating diet. - SLP eval - Trend CMP   #Hypothermia Patient noted to be hypothermic to 34C on arrival. She was otherwise hemodynamically stable. Beare hugger placed. She was in her usual state of health at the facility prior to this. Given advanced age and frailty, concern for possible infection. Patient given one dose of rocephin in the ED.  - F/u Blood cultures  - Broad spectrum antibiotics if remains hypothermic or develops fever  #Hypertension Patient on amlodipine for hypertension. Initially noted to be hypertensive with EMS with SBP 170s. Currently SBP ranging in 140s. She is currently NPO. - Holding amlodipine 5mg  daily at this time  #Macrocytic anemia - chronic Hb 12.1 with MCV 103.3. Folate and B12 previously wnl.  - Trend CBC  Best Practice: Diet: NPO pending SLP eval IVF: Fluids:  LR, Rate:  100 cc/hr x 20 hrs VTE: enoxaparin (LOVENOX) injection 40 mg Start: 02/18/21 1600 Code: DNR/DNI AB: None Status: Observation with expected length of stay less than 2 midnights. Anticipated Discharge Location: SNF Barriers to Discharge: Medical stability  Signature: Harvie Heck, MD Internal Medicine Resident, PGY-3 [facility]  Pager: [phone] 3:51 PM, 02/18/2021   Please contact the on call pager after 5 pm and on weekends at [phone].

## 2021-02-18 NOTE — Consult Note (Signed)
Neurology Consultation  Reason for Consult: Seizure-like activity witnessed at SNF without return to baseline mental status Referring Physician: L. Percell Miller, PA   CC: Patient does not provide a chief complaint, does not vocalize  History is obtained from: Chart review, unable to obtain from patient due to patient's mental status, no family at bedside  HPI: Wanda Conner is a 87 y.o. female with a medical history significant for essential hypertension, Alzheimer's dementia with behavioral disturbance, depression, thrombocytopenia, and recent presentation in May for concerns for seizure-like activity on home Depakote who presented to the ED 9/29 for evaluation of seizure-like activity witnessed at her assisted living facility. At her facility patient was sitting in her wheelchair when staff visualized her shaking, fall to the ground, with foaming at the mouth.  On EMS arrival patient was lying on her side, tense but without active seizures. On arrival to the emergency room patient was grunting with a hematoma on her left forehead.  Work-up revealed patient to be hypothermic with a rectal temperature of 94 F and her Depakote level was subtherapeutic at 29.  Patient's son was concerned that patient has ongoing altered mental status as she can typically answer simple questions by saying "yes" but she is not answering his questions as per her baseline.   Regarding her seizure history, patient was evaluated at [facility] in May 2022 after facility staff witnessed patient with seizure-like activity and 2 additional seizure-like episodes witnessed by EMS.  On arrival patient remained lethargic was only responding to painful stimuli, however, at this time it was reported by patient's son at bedside that she was out of her baseline mental and functional status.  Imaging at that time revealed patient with hydrocephalus progressing since 2016 but without acute process.  EEG at this time revealed moderate diffuse  encephalopathy without seizures or epileptiform discharges.  Her valproic acid was subtherapeutic during this admission at 42 and her home dose was increased from 125 mg twice daily to 250 mg twice daily.   ROS: Unable to obtain due to altered mental status.   Past Medical History:  Diagnosis Date   Agitation 09/02/2014   Anemia    History of this   Arthritis    Dementia with behavioral disturbance ([place]) 08/21/2014   Depression    Elevated vitamin B12 level 09/02/2014   GERD (gastroesophageal reflux disease)    Hypertension, essential, benign 11/21/2011   Osteoarthritis of left knee 10/31/2011   Status post knee replacement on 10/31/2011.    Palpitations 12/12/2012   Personal history of peptic ulcer disease 11/12/2009   Qualifier: Diagnosis of  By: Mingo Amber MD, Jeff     Seasonal allergies    Urge incontinence 12/29/2010   Uterine prolapse 10/25/2013   Weight loss, unintentional 09/18/2014   Past Surgical History:  Procedure Laterality Date   BREAST SURGERY     cyst removal   JOINT REPLACEMENT     Right kee replacement 2009   TOTAL KNEE ARTHROPLASTY  10/31/2011   Procedure: TOTAL KNEE ARTHROPLASTY;  Surgeon: Kerin Salen, MD;  Location: [place];  Service: Orthopedics;  Laterality: Left;   Family History  Problem Relation Age of Onset   Diabetes Father    Diabetes Brother    Social History:   reports that she has never smoked. She has never used smokeless tobacco. She reports that she does not drink alcohol and does not use drugs.  Medications  Current Facility-Administered Medications:    acetaminophen (TYLENOL) tablet 650 mg, 650  mg, Oral, Q6H PRN **OR** acetaminophen (TYLENOL) suppository 650 mg, 650 mg, Rectal, Q6H PRN, Aslam, Sadia, MD   divalproex (DEPAKOTE) DR tablet 1,000 mg, 1,000 mg, Oral, Once, Suella Broad A, PA-C   enoxaparin (LOVENOX) injection 40 mg, 40 mg, Subcutaneous, Q24H, Aslam, Sadia, MD   LORazepam (ATIVAN) injection 4 mg, 4 mg, Intravenous, Q5 Min x 2 PRN, Suella Broad A, PA-C   sodium chloride flush (NS) 0.9 % injection 3 mL, 3 mL, Intravenous, Q12H, Aslam, Sadia, MD, 3 mL at 02/18/21 1246  Current Outpatient Medications:    acetaminophen (TYLENOL) 500 MG tablet, Take 500 mg by mouth every 4 (four) hours as needed for headache, mild pain or fever (CANNOT EXCEED 2,000 MG/24 HOURS)., Disp: , Rfl:    amLODipine (NORVASC) 5 MG tablet, Take 5 mg by mouth daily., Disp: , Rfl:    Cholecalciferol (VITAMIN D3 SUPER STRENGTH) 50 MCG (2000 UT) CAPS, Take 2,000 Units by mouth daily., Disp: , Rfl:    divalproex (DEPAKOTE) 250 MG DR tablet, Take 1 tablet (250 mg total) by mouth 3 (three) times daily., Disp: 90 tablet, Rfl: 0   donepezil (ARICEPT) 10 MG tablet, Take 10 mg by mouth every evening., Disp: , Rfl:    guaifenesin (ROBITUSSIN) 100 MG/5ML syrup, Take 200 mg by mouth every 6 (six) hours as needed for cough., Disp: , Rfl:    loperamide (IMODIUM) 2 MG capsule, Take 2 mg by mouth as needed for diarrhea or loose stools (MAX OF 8 DOSES/24 HOURS)., Disp: , Rfl:    losartan (COZAAR) 100 MG tablet, Take 100 mg by mouth daily., Disp: , Rfl:    magnesium hydroxide (MILK OF MAGNESIA) 400 MG/5ML suspension, Take 30 mLs by mouth at bedtime as needed for mild constipation., Disp: , Rfl:    MINTOX 627-035-00 MG/5ML suspension, Take 30 mLs by mouth every 6 (six) hours as needed for indigestion or heartburn (CANNOT EXCEED 4 DOSES/24 HOURS)., Disp: , Rfl:    Neomycin-Bacitracin-Polymyxin (TRIPLE ANTIBIOTIC FIRST AID) [id number] OINT, Apply 1 application topically daily as needed (minor skin tears or abrasions- clean with normal saline first and bandage afterwards)., Disp: , Rfl:    OVER THE COUNTER MEDICATION, Take 1 Bottle by mouth in the morning, at noon, and at bedtime. Mighty Shakes Liquid, Disp: , Rfl:    sertraline (ZOLOFT) 25 MG tablet, Take 25 mg by mouth daily., Disp: , Rfl:    divalproex (DEPAKOTE SPRINKLE) 125 MG capsule, Take 2 capsules (250 mg total) by mouth 3  (three) times daily., Disp: 120 capsule, Rfl: 0  Exam: Current vital signs: BP (!) 156/56 (BP Location: Right Arm)   Pulse 72   Temp (!) 96.9 F (36.1 C) (Bladder)   Resp 17   Ht 5\' 4"  (1.626 m)   Wt 61.2 kg   SpO2 97%   BMI 23.16 kg/m  Vital signs in last 24 hours: Temp:  [94 F (34.4 C)-96.9 F (36.1 C)] 96.9 F (36.1 C) (09/29 1233) Pulse Rate:  [52-73] 72 (09/29 1233) Resp:  [8-25] 17 (09/29 1233) BP: (106-156)/(45-58) 156/56 (09/29 1233) SpO2:  [94 %-99 %] 97 % (09/29 1233) Weight:  [61.2 kg] 61.2 kg (09/29 1233)  GENERAL: Frail appearing female laying in ER stretcher, in no acute distress Psych: Unable to assess due to patient's mental status Head: Normocephalic with left forehead hematoma  EENT: Normal conjunctivae, dry mucous membranes, no OP obstruction LUNGS: Normal respiratory effort. Non-labored breathing on room air CV: Regular rate and rhythm on telemetry ABDOMEN:  Soft, non-distended Extremities: warm, well perfused, without obvious deformity  NEURO:  Mental Status: Asleep initially, wakes easily to voice.  When examiner says patient's name she states "yeah" but does not provide any further verbalization throughout assessment. She is unable to provide a clear and coherent history of present illness. She does not attempt to name objects, repeat phrases, answer orientation questions.   She does not follow commands. No neglect is noted Cranial Nerves:  II: PERRL 4 mm/brisk.   III, IV, VI: Will track examiner throughout visual fields. V: Blinks to threat throughout.  VII: Face is symmetric resting and grimacing. VIII: Hearing is intact to voice IX, X: Unable to assess palate XI: Head is grossly midline XII: Patient does not protrude tongue to command Motor:  She withdraws all extremities to noxious stimulation, though she appears to be weaker on the right than left Tone is normal. Bulk is normal.  Sensation: Grimaces and withdraws with light application  of noxious stimuli throughout. Increases restless movements of each extremity with manipulation.   Coordination: Unable to assess, patient does not follow commands DTRs: 2+ and symmetric patellae, biceps  Gait: Deferred  Labs I have reviewed labs in epic and the results pertinent to this consultation are: CBC    Component Value Date/Time   WBC 9.2 02/18/2021 0837   RBC 3.68 (L) 02/18/2021 0837   HGB 12.1 02/18/2021 0837   HCT 38.0 02/18/2021 0837   PLT 268 02/18/2021 0837   MCV 103.3 (H) 02/18/2021 0837   MCH 32.9 02/18/2021 0837   MCHC 31.8 02/18/2021 0837   RDW 13.6 02/18/2021 0837   LYMPHSABS 1.0 02/18/2021 0837   MONOABS 0.6 02/18/2021 0837   EOSABS 0.0 02/18/2021 0837   BASOSABS 0.0 02/18/2021 0837   CMP     Component Value Date/Time   NA 140 02/18/2021 0837   K 4.4 02/18/2021 0837   CL 105 02/18/2021 0837   CO2 27 02/18/2021 0837   GLUCOSE 91 02/18/2021 0837   BUN 23 02/18/2021 0837   CREATININE 0.70 02/18/2021 0837   CREATININE 0.59 08/27/2013 1549   CALCIUM 8.9 02/18/2021 0837   PROT 6.9 02/18/2021 0837   ALBUMIN 3.1 (L) 02/18/2021 0837   AST 17 02/18/2021 0837   ALT 17 02/18/2021 0837   ALKPHOS 93 02/18/2021 0837   BILITOT 0.5 02/18/2021 0837   GFRNONAA >60 02/18/2021 0837   GFRNONAA 89 08/27/2013 1549   GFRAA >89 08/27/2013 1549   Lipid Panel     Component Value Date/Time   LDLDIRECT 157 (H) 08/27/2013 1549   Lab Results  Component Value Date   VALPROATE 29 (L) 02/18/2021   Urinalysis    Component Value Date/Time   COLORURINE YELLOW 02/18/2021 1138   APPEARANCEUR CLEAR 02/18/2021 1138   LABSPEC 1.018 02/18/2021 1138   PHURINE 7.0 02/18/2021 [place] 02/18/2021 1138   [place] 02/18/2021 1138   BILIRUBINUR NEGATIVE 02/18/2021 1138   [place] 02/18/2021 1138   PROTEINUR NEGATIVE 02/18/2021 1138   UROBILINOGEN 0.2 10/25/2011 1057   NITRITE NEGATIVE 02/18/2021 1138   LEUKOCYTESUR NEGATIVE 02/18/2021 1138    Lab Results  Component Value Date   VITAMINB12 437 10/08/2020   Lab Results  Component Value Date   TSH 3.861 10/08/2020   Urinalysis    Component Value Date/Time   COLORURINE YELLOW 02/18/2021 [place] 02/18/2021 1138   LABSPEC 1.018 02/18/2021 1138   [place] 7.0 02/18/2021 1138   [place] 02/18/2021 1138  HGBUR NEGATIVE 02/18/2021 1138   BILIRUBINUR NEGATIVE 02/18/2021 1138   [place] 02/18/2021 1138   PROTEINUR NEGATIVE 02/18/2021 1138   UROBILINOGEN 0.2 10/25/2011 1057   NITRITE NEGATIVE 02/18/2021 1138   LEUKOCYTESUR NEGATIVE 02/18/2021 1138   Drugs of Abuse     Component Value Date/Time   LABOPIA NONE DETECTED 02/18/2021 1139   COCAINSCRNUR NONE DETECTED 02/18/2021 1139   LABBENZ NONE DETECTED 02/18/2021 1139   AMPHETMU NONE DETECTED 02/18/2021 1139   THCU NONE DETECTED 02/18/2021 1139   LABBARB NONE DETECTED 02/18/2021 1139    Imaging I have reviewed the images obtained:  CT-scan of the brain 02/18/2021: 1. No acute intracranial abnormality or acute traumatic injury identified. 2. Stable non contrast CT appearance of the brain with chronic ischemic disease and ventriculomegaly.  CT cervical spine 9/29: 1. No acute traumatic injury identified in the cervical spine. 2. Mild T1 superior endplate compression fracture is new since 2014, but appears to be chronic. 3. Mild for age cervical spine degeneration. 4. Aortic Atherosclerosis (ICD10-I70.0).  Assessment: 87 year old female who presented from her SNF for evaluation of seizure-like activity with shaking, fall out of wheelchair, foaming at the mouth and without return to baseline mental status while in the ED per patient's son. Similar presentation in May 2022 with Depakote dosing increased during admission. -Examination reveals patient who opens eyes to voice, says "yeah" once but does not provide further vocalization and does not follow commands.  Per son patient is not at  baseline. - Valproic acid level subtherapeutic at 29.  Unclear if patient missed any medication doses at her facility.  Alternatively, due to hypothermia, patient has been started on antibiotics for concern for infection of unknown etiology that may contribute to seizure provocation.  Pending blood cultures and lactic acid.  Patient with known hydrocephalus, stable from previous imaging. - Patient's acute encephalopathy may be 2/2 seizure with postictal state or infectious etiology.  Further work-up pending.  Recommendations: - Hospitalist admission for temperature management and infectious work up- blood cultures and lactate pending - Inpatient seizure precautions - Routine EEG - Ativan 1-2 mg IV PRN any seizure lasting > 5 minutes and notify neurology - Recommended to load with Depacon 1,000 mg PO once in the ED, however, patient is unable to tolerate PO intake at this time. When restarting Depakote, load with 1,000 mg PO  - Increase Depacon home dose to 250 mg TID at discharge  - Okay to use Keppra IV with caution for now for seizure prophylaxis while she is unable to tolerate PO intake but due to history of agitation and behavioral disturbance, would not want her to remain on Keppra for an extended period of time   Pt seen by NP/Neuro and later by MD. Note/plan to be edited by MD as needed.  Anibal Henderson, AGAC-NP [facility] Pager: [phone]   I have seen the patient reviewed the above note.  She has been confused after a breakthrough seizure.  No definite source of infection.  It is possible that her hypothermia was a postictal finding, especially in severe dementia postictal states can last longer and hypothermia is more common.  Over the long-term I would favor Depakote, but given there is a shortage of IV formulation of this, we can use Keppra for now to temporize her.  Recommendations as above.  Roland Rack, MD [facility] [phone]  If  7pm- 7am, please page neurology on call as listed in [place].

## 2021-02-19 ENCOUNTER — Encounter (HOSPITAL_COMMUNITY): Payer: Self-pay | Admitting: Internal Medicine

## 2021-02-19 DIAGNOSIS — G9340 Encephalopathy, unspecified: Secondary | ICD-10-CM | POA: Diagnosis present

## 2021-02-19 DIAGNOSIS — R569 Unspecified convulsions: Secondary | ICD-10-CM | POA: Diagnosis not present

## 2021-02-19 DIAGNOSIS — R1310 Dysphagia, unspecified: Secondary | ICD-10-CM | POA: Diagnosis present

## 2021-02-19 DIAGNOSIS — Z6823 Body mass index (BMI) 23.0-23.9, adult: Secondary | ICD-10-CM | POA: Diagnosis not present

## 2021-02-19 DIAGNOSIS — G309 Alzheimer's disease, unspecified: Secondary | ICD-10-CM | POA: Diagnosis present

## 2021-02-19 DIAGNOSIS — F32A Depression, unspecified: Secondary | ICD-10-CM | POA: Diagnosis present

## 2021-02-19 DIAGNOSIS — R64 Cachexia: Secondary | ICD-10-CM | POA: Diagnosis present

## 2021-02-19 DIAGNOSIS — G40909 Epilepsy, unspecified, not intractable, without status epilepticus: Secondary | ICD-10-CM

## 2021-02-19 DIAGNOSIS — Z8711 Personal history of peptic ulcer disease: Secondary | ICD-10-CM | POA: Diagnosis not present

## 2021-02-19 DIAGNOSIS — T68XXXA Hypothermia, initial encounter: Secondary | ICD-10-CM | POA: Diagnosis present

## 2021-02-19 DIAGNOSIS — D539 Nutritional anemia, unspecified: Secondary | ICD-10-CM | POA: Diagnosis present

## 2021-02-19 DIAGNOSIS — I452 Bifascicular block: Secondary | ICD-10-CM | POA: Diagnosis present

## 2021-02-19 DIAGNOSIS — R638 Other symptoms and signs concerning food and fluid intake: Secondary | ICD-10-CM

## 2021-02-19 DIAGNOSIS — Z515 Encounter for palliative care: Secondary | ICD-10-CM | POA: Diagnosis not present

## 2021-02-19 DIAGNOSIS — Z7401 Bed confinement status: Secondary | ICD-10-CM | POA: Diagnosis not present

## 2021-02-19 DIAGNOSIS — Z79899 Other long term (current) drug therapy: Secondary | ICD-10-CM | POA: Diagnosis not present

## 2021-02-19 DIAGNOSIS — W050XXA Fall from non-moving wheelchair, initial encounter: Secondary | ICD-10-CM | POA: Diagnosis present

## 2021-02-19 DIAGNOSIS — W19XXXA Unspecified fall, initial encounter: Secondary | ICD-10-CM | POA: Diagnosis not present

## 2021-02-19 DIAGNOSIS — G914 Hydrocephalus in diseases classified elsewhere: Secondary | ICD-10-CM | POA: Diagnosis present

## 2021-02-19 DIAGNOSIS — Z20822 Contact with and (suspected) exposure to covid-19: Secondary | ICD-10-CM | POA: Diagnosis present

## 2021-02-19 DIAGNOSIS — I119 Hypertensive heart disease without heart failure: Secondary | ICD-10-CM | POA: Diagnosis present

## 2021-02-19 DIAGNOSIS — K219 Gastro-esophageal reflux disease without esophagitis: Secondary | ICD-10-CM | POA: Diagnosis present

## 2021-02-19 DIAGNOSIS — E785 Hyperlipidemia, unspecified: Secondary | ICD-10-CM | POA: Diagnosis present

## 2021-02-19 DIAGNOSIS — S0083XA Contusion of other part of head, initial encounter: Secondary | ICD-10-CM | POA: Diagnosis present

## 2021-02-19 DIAGNOSIS — F02C18 Dementia in other diseases classified elsewhere, severe, with other behavioral disturbance: Secondary | ICD-10-CM | POA: Diagnosis present

## 2021-02-19 DIAGNOSIS — Z7189 Other specified counseling: Secondary | ICD-10-CM

## 2021-02-19 DIAGNOSIS — Z66 Do not resuscitate: Secondary | ICD-10-CM | POA: Diagnosis present

## 2021-02-19 DIAGNOSIS — Z833 Family history of diabetes mellitus: Secondary | ICD-10-CM | POA: Diagnosis not present

## 2021-02-19 DIAGNOSIS — E46 Unspecified protein-calorie malnutrition: Secondary | ICD-10-CM | POA: Diagnosis present

## 2021-02-19 LAB — BASIC METABOLIC PANEL
Anion gap: 8 (ref 5–15)
BUN: 23 mg/dL (ref 8–23)
CO2: 24 mmol/L (ref 22–32)
Calcium: 8.5 mg/dL — ABNORMAL LOW (ref 8.9–10.3)
Chloride: 108 mmol/L (ref 98–111)
Creatinine, Ser: 1.26 mg/dL — ABNORMAL HIGH (ref 0.44–1.00)
GFR, Estimated: 42 mL/min — ABNORMAL LOW (ref >60)
Glucose, Bld: 88 mg/dL (ref 70–99)
Potassium: 4.5 mmol/L (ref 3.5–5.1)
Sodium: 140 mmol/L (ref 135–145)

## 2021-02-19 LAB — CBC
HCT: 34.3 % — ABNORMAL LOW (ref 36.0–46.0)
Hemoglobin: 10.8 g/dL — ABNORMAL LOW (ref 12.0–15.0)
MCH: 32.4 pg (ref 26.0–34.0)
MCHC: 31.5 g/dL (ref 30.0–36.0)
MCV: 103 fL — ABNORMAL HIGH (ref 80.0–100.0)
Platelets: 226 10*3/uL (ref 150–400)
RBC: 3.33 MIL/uL — ABNORMAL LOW (ref 3.87–5.11)
RDW: 14.1 % (ref 11.5–15.5)
WBC: 9.2 10*3/uL (ref 4.0–10.5)
nRBC: 0 % (ref 0.0–0.2)

## 2021-02-19 LAB — URINE CULTURE: Culture: NO GROWTH

## 2021-02-19 LAB — CBG MONITORING, ED: Glucose-Capillary: 77 mg/dL (ref 70–99)

## 2021-02-19 LAB — TSH: TSH: 3.33 u[IU]/mL (ref 0.350–4.500)

## 2021-02-19 MED ORDER — GLYCOPYRROLATE 0.2 MG/ML IJ SOLN: 0.2000 mg | INTRAMUSCULAR | Status: DC | PRN

## 2021-02-19 MED ORDER — HALOPERIDOL LACTATE 5 MG/ML IJ SOLN: 2.0000 mg | Freq: Four times a day (QID) | INTRAMUSCULAR | Status: DC | PRN

## 2021-02-19 MED ORDER — LORAZEPAM 1 MG PO TABS: 1.0000 mg | ORAL_TABLET | ORAL | Status: DC | PRN

## 2021-02-19 MED ORDER — HALOPERIDOL 1 MG PO TABS
2.0000 mg | ORAL_TABLET | Freq: Four times a day (QID) | ORAL | Status: DC | PRN
  Filled 2021-02-19: qty 2

## 2021-02-19 MED ORDER — POLYVINYL ALCOHOL 1.4 % OP SOLN
1.0000 [drp] | Freq: Four times a day (QID) | OPHTHALMIC | Status: DC | PRN
  Filled 2021-02-19: qty 15

## 2021-02-19 MED ORDER — BIOTENE DRY MOUTH MT LIQD
15.0000 mL | Freq: Two times a day (BID) | OROMUCOSAL | Status: DC
  Administered 2021-02-19 – 2021-02-20 (×2): 15 mL via TOPICAL

## 2021-02-19 MED ORDER — SODIUM CHLORIDE 0.9 % IV SOLN
INTRAVENOUS | Status: DC | PRN
  Administered 2021-02-19: 250 mL via INTRAVENOUS

## 2021-02-19 MED ORDER — MORPHINE SULFATE (PF) 2 MG/ML IV SOLN
1.0000 mg | INTRAVENOUS | Status: DC | PRN
  Administered 2021-02-20 – 2021-02-23 (×5): 2 mg via INTRAVENOUS
  Filled 2021-02-19 (×5): qty 1

## 2021-02-19 MED ORDER — ONDANSETRON HCL 4 MG/2ML IJ SOLN: 4.0000 mg | Freq: Four times a day (QID) | INTRAMUSCULAR | Status: DC | PRN

## 2021-02-19 MED ORDER — HALOPERIDOL LACTATE 2 MG/ML PO CONC
2.0000 mg | Freq: Four times a day (QID) | ORAL | Status: DC | PRN
  Filled 2021-02-19: qty 1

## 2021-02-19 MED ORDER — ENSURE ENLIVE PO LIQD: 237.0000 mL | Freq: Two times a day (BID) | ORAL | Status: DC

## 2021-02-19 MED ORDER — GLYCOPYRROLATE 1 MG PO TABS
1.0000 mg | ORAL_TABLET | ORAL | Status: DC | PRN
  Filled 2021-02-19: qty 1

## 2021-02-19 MED ORDER — LORAZEPAM 2 MG/ML PO CONC
1.0000 mg | ORAL | Status: DC | PRN
  Filled 2021-02-19: qty 0.5

## 2021-02-19 MED ORDER — LORAZEPAM 2 MG/ML IJ SOLN
1.0000 mg | INTRAMUSCULAR | Status: DC | PRN
  Administered 2021-02-22: 1 mg via INTRAVENOUS
  Filled 2021-02-19: qty 1

## 2021-02-19 MED ORDER — ONDANSETRON 4 MG PO TBDP: 4.0000 mg | ORAL_TABLET | Freq: Four times a day (QID) | ORAL | Status: DC | PRN

## 2021-02-19 NOTE — Progress Notes (Signed)
PT Cancellation Note  Patient Details Name: Wanda Conner MRN: [id number] DOB: 05/12/1934   Cancelled Treatment:    Reason Eval/Treat Not Completed: Fatigue/lethargy limiting ability to participate.  Pt was attempted but does not respond to calling her name repeatedly.  Made one sound to her first name, then silent.  Retry as time and pt allow.   Ramond Dial 02/19/2021, 11:00 AM  Mee Hives, PT MS Acute Rehab Dept. Number: [place] [facility] [place]

## 2021-02-19 NOTE — Progress Notes (Signed)
Daily Progress Note   Patient Name: Wanda Conner       Date: 02/19/2021 DOB: 1933-09-19  Age: 87 y.o. MRN#: [id number] Attending Physician: Sid Falcon, MD Primary Care Physician: Patient, [name] (Inactive) Admit Date: 02/18/2021  Reason for Consultation/Follow-up: Disposition and Establishing goals of care  Subjective: Chart review performed. Received report from primary RN - no acute concerns. RN reports patient has been lethargic all day.   Went to visit patient at bedside - no family/visitors present. Patient was lying in bed asleep - she does briefly wake to voice/gentle touch but promptly falls back asleep. She remains nonverbal and does not answer questions, which is her baseline. No signs or non-verbal gestures of pain or discomfort noted. No respiratory distress, increased work of breathing, or secretions noted.   4:00 PM Met with son/Wanda Conner via phone  to discuss diagnosis, prognosis, GOC, EOL wishes, disposition, and options.  I introduced Palliative Medicine as specialized medical care for people living with serious illness. It focuses on providing relief from the symptoms and stress of a serious illness. The goal is to improve quality of life for both the patient and the family.  We discussed a brief life review of the patient as well as functional and nutritional status. Patient's husband passed away in 08-12-14 - they had 4 children together, 3 daughters and 1 son. Wanda Conner is not certain when patient was diagnosed with dementia, but feels it was around 2014/08/12. Prior to hospitalization, patient was living at [facility] in their memory care. Son explains she has been bedbound for around 1 year. Over the last year or so, he notes a physical and mental decline in her health -  noting that she "talks very little and sleeps a lot." He also notes she has become incontinent.    We discussed patient's current illness and what it means in the larger context of patient's on-going co-morbidities.  Wanda Conner understands that dementia is a progressive, non-curable disease underlying the patient's current acute medical conditions. We reviewed specific indicators of end stage dementia, including inability to communicate, bed bound/non-ambulatory status, decreased oral intake, swallowing dysfunction, and incontinence of bowel/bladder. Natural disease trajectory and expectations at EOL were discussed. I attempted to elicit values and goals of care important to the patient. The difference between aggressive medical intervention  and Conner care was considered in light of the patient's goals of care. Wanda Conner explains that his goal for patient is not to prolong her life or "keep her alive because she has suffered enough."  We talked about transition to Conner measures in house and what that would entail inclusive of medications to control pain, dyspnea, agitation, nausea, itching, and hiccups. We discussed stopping all unnecessary measures such as blood draws, needle sticks, oxygen, antibiotics, CBGs/insulin, cardiac monitoring, IVF, and frequent vital signs. Wanda Conner is agreeable for transition to full Conner measures today. We reviewed that seizure medication could be considered a Conner measure - will continue this.   Education provided that without hospice services at discharge, patient is at high risk for rehospitalization. Wanda Conner is clear he would not want patient rehospitalized once discharged.   Per Wanda Conner's request, provided education and counseling at length on the philosophy and benefits of hospice care. Discussed that it offers a holistic approach to care in the setting of end-stage illness, and is about supporting the patient where they are allowing nature to take it's course. Discussed  the hospice team includes RNs, physicians, social workers, and chaplains. They can provide personal care, support for the family, and help keep patient out of the hospital as well as assist with DME needs for home hospice. Wanda Conner is agreeable to hospice services at discharge. He is hopeful Wanda Conner can continue to care for patient with hospice. If [place] has a partnership with hospice organization, Wanda Conner is agreeable to utilize whichever [employer] works with.  Advance directives, concepts specific to code status, artificial feeding and hydration, and rehospitalization were considered and discussed. Wanda Conner states he has legal guardianship over patient - he makes her medical decisions but not financial ones - requested copy of paperwork.   Discussed with family the importance of continued conversation with medical providers regarding overall plan of care and treatment options, ensuring decisions are within the context of the patient's values and GOCs.    Questions and concerns were addressed. The patient/family was encouraged to call with questions and/or concerns. PMT number was provided.   Length of Stay: 0  Current Medications: Scheduled Meds:   enoxaparin (LOVENOX) injection  40 mg Subcutaneous Q24H   sodium chloride flush  3 mL Intravenous Q12H    Continuous Infusions:  levETIRAcetam Stopped (02/19/21 0624)    PRN Meds: acetaminophen **OR** acetaminophen, LORazepam  Physical Exam Vitals and nursing note reviewed.  Constitutional:      General: She is not in acute distress.    Appearance: She is ill-appearing.  Pulmonary:     Effort: No respiratory distress.  Skin:    General: Skin is cool and dry.  Neurological:     Mental Status: She is lethargic.     Motor: Weakness present.  Psychiatric:        Speech: She is noncommunicative.        Cognition and Memory: Memory normal.            Vital Signs: BP 127/70   Pulse 60   Temp (!) 97.2 F  (36.2 C)   Resp 10   Ht 5\' 4"  (1.626 m)   Wt 61.2 kg   SpO2 97%   BMI 23.16 kg/m  SpO2: SpO2: 97 % O2 Device: O2 Device: Room Air O2 Flow Rate:    Intake/output summary:  Intake/Output Summary (Last 24 hours) at 02/19/2021 1424 Last data filed at 02/19/2021 1230 Gross per 24 hour  Intake --  Output 1000 ml  Net -1000 ml   LBM:   Baseline Weight: Weight: 61.2 kg Most recent weight: Weight: 61.2 kg       Palliative Assessment/Data: PPS 20%    Flowsheet Rows    Flowsheet Row Most Recent Value  Intake Tab   Referral Department Hospitalist  Unit at Time of Referral ER  Palliative Care Primary Diagnosis Neurology  Date Notified 02/18/21  Palliative Care Type New Palliative care  Reason for referral Clarify Goals of Care  Date of Admission 02/18/21  Date first seen by Palliative Care 02/18/21  # of days Palliative referral response time 0 Day(s)  # of days IP prior to Palliative referral 0  Clinical Assessment   Psychosocial & Spiritual Assessment   Palliative Care Outcomes   Patient/Family meeting held? No  [son requests meeting/full GOC tomorrow]       Patient Active Problem List   Diagnosis Date Noted   Protein-calorie malnutrition, mild (HCC)    Seizure ([place]) 10/08/2020   Hypernatremia 08/06/2020   Alzheimer's dementia with behavioral disturbance ([place]) 08/06/2020   Thrombocytopenia ([place]) 08/06/2020   Lactic acidosis 50/35/4656   Acute metabolic encephalopathy 81/27/5170   Open wound of foot except toe(s) alone 11/20/2014   Weight loss, unintentional 09/18/2014   Dementia without behavioral disturbance ([place]) 08/21/2014   Uterine prolapse 10/25/2013   Bony growth 12/12/2012   Hypertension, essential, benign 11/21/2011   Osteoarthritis of left knee 10/31/2011   Preventative health care 12/29/2010   Urge incontinence 12/29/2010    Palliative Care Assessment & Plan   Patient Profile: 87 y.o. female  with past medical history of advanced Alzheimer's  dementia with behavioral disturbance, hypertension, thrombocytopenia, hyperlipidemia, depression and hydrocephalus who presents to [facility] ED on 02/18/21 from [place] after witnessed seizure-like activity. Patient was admitted May 19-20, 2022 for seizure-like activity also. Patient was admitted on 02/18/2021 with breakthrough seizure, advanced dementia, protein calorie malnutrition.   Assessment: Breakthrough seizure Advanced Alzheimer's dementia Protein calorie malnutrition Hypothermia Terminal care  Recommendations/Plan: Initiated full Conner measures Continue DNR/DNI as previously documented Son would like patient transferred back to [facility] with hospice services as soon as possible  TOC notified and consulted for: home hospice referral Recommend continuing seizure medications and optimizing for discharge as this can be considered a Conner measure Added orders for EOL symptom management and to reflect full Conner measures, as well as discontinued orders that were not focused on Conner Unrestricted visitation orders were placed per current [place] EOL visitation policy  Nursing to provide frequent assessments and administer PRN medications as clinically necessary to ensure EOL Conner PMT will continue to follow and support holistically   Goals of Care and Additional Recommendations: Limitations on Scope of Treatment: Full Conner Care  Code Status:    Code Status Orders  (From admission, onward)           Start     Ordered   02/18/21 1234  Do not attempt resuscitation (DNR)  Continuous       Question Answer Comment  In the event of cardiac or respiratory ARREST Do not call a "code blue"   In the event of cardiac or respiratory ARREST Do not perform Intubation, CPR, defibrillation or ACLS   In the event of cardiac or respiratory ARREST Use medication by any route, position, wound care, and other measures to relive pain and suffering. May use oxygen,  suction and manual treatment of airway obstruction as needed for Conner.  02/18/21 1234           Code Status History     Date Active Date Inactive Code Status Order ID Comments User Context   10/08/2020 1552 10/10/2020 0233 Full Code [id number]  Harvie Heck, MD ED   10/08/2020 1530 10/08/2020 1552 DNR [id number]  Harvie Heck, MD ED   08/06/2020 0456 08/06/2020 1910 DNR [id number]  Vernelle Emerald, MD ED   08/05/2020 2326 08/06/2020 0456 DNR [id number]  Vernelle Emerald, MD ED   10/31/2011 1358 11/03/2011 1410 Full Code [id number]  Carron Brazen, RN Inpatient       Prognosis:  < 6 months  Discharge Planning: [place] with Hospice  Care plan was discussed with primary RN, patient's son, Dr. Lisabeth Devoid, Dr. Daryll Drown, [facility]  Thank you for allowing the Palliative Medicine Team to assist in the care of this patient.   Total Time 75 minutes Prolonged Time Billed  yes       Greater than 50%  of this time was spent counseling and coordinating care related to the above assessment and plan.  Lin Landsman, NP  Please contact Palliative Medicine Team phone at [phone] for questions and concerns.

## 2021-02-19 NOTE — ED Notes (Signed)
Palliative NP at bedside 

## 2021-02-19 NOTE — Progress Notes (Signed)
No significant events noted overnight.   She continues to say only a few words, which per report is baseline. She states "Watch it" over and over when I am examining her, followed by a string of curses. She says "uh-huh" when asked any yes/no question.   She withdraws to noxious stimulaiton x 34   A/p: 87 year old female who presented from her SNF for evaluation of seizure-like activity with shaking, fall out of wheelchair, foaming at the mouth consistent with seizure. If no othe rsource of infection is found, hypothermia can be a post-ictal finding and it commonly takes longer for folks with severe dementia to return to baseline.   I would continue IV keppra until the patient is taking PO, then change to depakote(can give as spinkle with applesauce if needed). I would give a 1 g oral load, then incerase to 250mg  TID at home(was reportedly on 125mg  TID).   Neurology will be available as needed.   Roland Rack, MD [facility] [phone]  If 7pm- 7am, please page neurology on call as listed in [place].

## 2021-02-19 NOTE — Hospital Course (Addendum)
Breakthrough seizure Advanced Alzheimer dementia Patient presented to ED 9/29 after experiencing a witnessed seizure-like activity including full body shaking while sitting in her wheelchair resulting in a fall where she hit her head and had drooling.  CT head showed no acute intracranial abnormality or acute traumatic injury, chronic ischemic disease and ventriculomegaly.  CT C-spine showed no acute traumatic injury, mild T1 superior endplate compression fracture that appears to be chronic, cervical spine degeneration, aortic atherosclerosis.  MRI brain showed no evidence of acute intracranial abnormality, severe atrophy and moderate chronic microvascular ischemic disease.  Chest x-ray showed cardiomegaly with central pulmonary vascular congestion.  CBC, CMP, UA within normal limits at time of presentation.  Valproic acid level subtherapeutic at 29.  She was initiated on IV Keppra 500 mg twice daily.  EEG was also completed which was suggestive of moderate diffuse encephalopathy with nonspecific etiology, no seizures or definite epileptiform discharges.  Protein calorie malnutrition SLP evaluation ordered and showed ***.  Hypothermia Patient was hypothermic to 30 4C on arrival.  She was placed on a Retail [employer].  She did receive 1 dose of Rocephin in the ED out of concerns for possible infection, however UA did not show signs of infection and she had no leukocytosis.  Blood cultures were drawn and showed ***.  Hypertension Chronically managed with amlodipine.  This was held.  Chronic macrocytic anemia Hemoglobin 10.8, hematocrit 34.3, MCV 103.0.  he will need a dnr signed on her chart. And the facility will most likely want any new medications scripts printed out (and controlled substances printed and signed)

## 2021-02-19 NOTE — ED Notes (Signed)
New warm blankets applied.

## 2021-02-19 NOTE — Progress Notes (Signed)
SLP Cancellation Note  Patient Details Name: LOREN VICENS MRN: [id number] DOB: 1934-02-01   Cancelled treatment:       Reason Eval/Treat Not Completed: Patient's level of consciousness. Will f/u as able   Baya Lentz, Katherene Ponto 02/19/2021, 10:55 AM

## 2021-02-19 NOTE — Progress Notes (Signed)
HD#0 SUBJECTIVE:  Patient Summary: Wanda Conner is a 87 y.o. with a pertinent PMH of Alzheimer's dementia with behavioral disturbance, hypertension, hyperlipidemia, depression, hydrocephalus, who presented with witnessed seizure-like activity and admitted for breakthrough seizures.   Overnight Events: None  Interim History: Patient sleeping in bed.  She does not respond when asked questions or follow commands.  She briefly opens her eyes when addressed by her first name but quickly closes them again.  OBJECTIVE:  Vital Signs: Vitals:   02/19/21 1100 02/19/21 1200 02/19/21 1300 02/19/21 1315  BP: (!) 119/56 (!) 115/43 (!) 118/52   Pulse: 70 62 (!) 58 (!) 56  Resp: 10 17 10 12   Temp: (!) 97.3 F (36.3 C) (!) 97.2 F (36.2 C) (!) 96.8 F (36 C) (!) 97.1 F (36.2 C)  TempSrc:      SpO2: 97% 100% 98% 96%  Weight:      Height:       Supplemental O2: Room Air SpO2: 96 %  Filed Weights   02/18/21 1233  Weight: 61.2 kg     Intake/Output Summary (Last 24 hours) at 02/19/2021 1331 Last data filed at 02/19/2021 1230 Gross per 24 hour  Intake --  Output 1000 ml  Net -1000 ml   Net IO Since Admission: -1,000 mL [02/19/21 1331]  Physical Exam: Constitutional: Patient is lying in bed asleep.  No acute distress noted. Eyes: Minimal eye opening when spoken to.  Briefly opens eyes to first name. Cardio: Regular rate and rhythm.  No murmurs, rubs, gallops. Pulm: Normal work of breathing on room air. Abdomen: Soft, nontender, nondistended. MSK: Patient is lying with upper extremities contracted inward. Skin: Skin is warm and dry. Neuro: Patient does not verbally communicate, does not follow commands.  As above, bilateral upper extremities are contracted though she does resist extension of these extremities.  Eye-opening only when addressed by first name.   Patient Lines/Drains/Airways Status     Active Line/Drains/Airways     Name Placement date Placement time Site Days    Peripheral IV 02/18/21 20 G Left;Posterior Hand 02/18/21  0752  Hand  1   Closed System Drain 10/31/11  1126  --  3399   Urethral Catheter JS Straight-tip 14 Fr. 02/18/21  1120  Straight-tip  1   External Urinary Catheter 10/08/20  1930  --  134   Incision 10/31/11 Leg Left 10/31/11  1058  -- 3399             ASSESSMENT/PLAN:  Assessment: Active Problems:   Seizure ([place])   Plan: #Breakthrough Seizure #Advanced Alzheimer dementia Patient has not had any seizure-like activity since presenting to ED.  Infectious work-up was initiated secondary to concerns of infectious etiology causing breakthrough seizures.  Admission labs demonstrated subtherapeutic levels of valproic acid though she was reportedly on 125 mg 3 times daily at home. - Neurology consulted, appreciate recommendations  -IV Keppra 500 mg twice daily  -Transition to p.o. Depakote (can be given as sprinkled into applesauce) as tolerated by patient.  This should be given and a 1 g oral load, then increase to 250 mg 3 times daily on discharge. - CXR not significant for pneumonia - MRI Brain showed no evidence of acute intracranial abnormality, severe atrophy and moderate chronic microvascular ischemic change. - EEG showed diffuse encephalopathy but no seizures or definite epileptiform discharges -Palliative care consulted, appreciate their assistance  -Planning for 4 PM phone call for goals of care discussion today with son Wanda Conner   #  Protein calorie malnutrition Patient is thin and cachectic appearing on physical exam.  Albumin is low at 3.1. - SLP consulted however due to patient's somnolence swallowing evaluation was not able to be completed at this time. - Trend CMP    #Hypothermia Patient has been largely normothermic. -Blood cultures have shown no growth to this point -Consider broad spectrum antibiotics if remains hypothermic or develops fever   #Hypertension Normotensive at 127/70. -Continue to hold amlodipine  5mg  daily   #Macrocytic anemia - chronic Hb 10.8 with MCV 103.0. - Trend CBC  Best Practice: Diet: NPO IVF: Fluids: None VTE: enoxaparin (LOVENOX) injection 40 mg Start: 02/18/21 1600 Code: DNR AB: None DISPO: Continue to monitor inpatient.  Signature: Farrel Gordon, D.O.  Internal Medicine Resident, PGY-1 [facility]  Pager: [phone] 1:31 PM, 02/19/2021   Please contact the on call pager after 5 pm and on weekends at [phone].

## 2021-02-19 NOTE — Progress Notes (Signed)
  Date: 02/19/2021  Patient name: AVIYANA Conner  Medical record number: [id number]  Date of birth: 1934/02/13   I have seen and evaluated Wanda Conner and discussed their care with the Residency Team. Briefly, Wanda Conner came in with a breakthrough seizure at her living facility.  There was concern for infection given the low temperature, but no other signs/symptoms of infection have been found.    Vitals:   02/19/21 1535 02/19/21 1700  BP: (!) 126/59 134/63  Pulse: 69 70  Resp: 16 19  Temp: (!) 97.2 F (36.2 C) 98.7 F (37.1 C)  SpO2: 98% 95%   Gen: Elderly woman, awake to voice, but does not speak or respond to commands Eyes: anicteric sclerae CV: RR, NR, no murmur.  No pedal edema Pulm: breathing comfortably on room air MSK: Contractures of upper arms Neuro: Moving extremities, but not responding to commands. Open eyes to name.   Assessment and Plan: I have seen and evaluated the patient as outlined above. I agree with the formulated Assessment and Plan as detailed in the residents' note, with the following changes:   1. Breakthrough seizure, advanced AD - IV keppra until tolerating PO - EEG done - Neurology consulted, will follow recommendations - Palliative care consulted - follow up family conversation today  Other issues per Dr. Randel Pigg daily note today.   Sid Falcon, MD 9/30/20226:27 PM

## 2021-02-20 LAB — CBC
HCT: 34.5 % — ABNORMAL LOW (ref 36.0–46.0)
Hemoglobin: 11.1 g/dL — ABNORMAL LOW (ref 12.0–15.0)
MCH: 32.6 pg (ref 26.0–34.0)
MCHC: 32.2 g/dL (ref 30.0–36.0)
MCV: 101.5 fL — ABNORMAL HIGH (ref 80.0–100.0)
Platelets: 219 10*3/uL (ref 150–400)
RBC: 3.4 MIL/uL — ABNORMAL LOW (ref 3.87–5.11)
RDW: 13.8 % (ref 11.5–15.5)
WBC: 9 10*3/uL (ref 4.0–10.5)
nRBC: 0 % (ref 0.0–0.2)

## 2021-02-20 LAB — COMPREHENSIVE METABOLIC PANEL
ALT: 14 U/L (ref 0–44)
AST: 17 U/L (ref 15–41)
Albumin: 2.6 g/dL — ABNORMAL LOW (ref 3.5–5.0)
Alkaline Phosphatase: 74 U/L (ref 38–126)
Anion gap: 9 (ref 5–15)
BUN: 17 mg/dL (ref 8–23)
CO2: 24 mmol/L (ref 22–32)
Calcium: 8.6 mg/dL — ABNORMAL LOW (ref 8.9–10.3)
Chloride: 108 mmol/L (ref 98–111)
Creatinine, Ser: 0.87 mg/dL (ref 0.44–1.00)
GFR, Estimated: >60 mL/min (ref >60)
Glucose, Bld: 86 mg/dL (ref 70–99)
Potassium: 4.1 mmol/L (ref 3.5–5.1)
Sodium: 141 mmol/L (ref 135–145)
Total Bilirubin: 0.3 mg/dL (ref 0.3–1.2)
Total Protein: 5.9 g/dL — ABNORMAL LOW (ref 6.5–8.1)

## 2021-02-20 LAB — GLUCOSE, CAPILLARY: Glucose-Capillary: 79 mg/dL (ref 70–99)

## 2021-02-20 MED ORDER — CHLORHEXIDINE GLUCONATE 0.12 % MT SOLN
15.0000 mL | Freq: Two times a day (BID) | OROMUCOSAL | Status: DC
  Administered 2021-02-20 – 2021-02-23 (×8): 15 mL via OROMUCOSAL
  Filled 2021-02-20 (×7): qty 15

## 2021-02-20 MED ORDER — ORAL CARE MOUTH RINSE
15.0000 mL | Freq: Two times a day (BID) | OROMUCOSAL | Status: DC
  Administered 2021-02-20 – 2021-02-23 (×7): 15 mL via OROMUCOSAL

## 2021-02-20 MED ORDER — DIVALPROEX SODIUM 125 MG PO CSDR
1000.0000 mg | DELAYED_RELEASE_CAPSULE | Freq: Once | ORAL | Status: AC
  Administered 2021-02-20: 1000 mg via ORAL
  Filled 2021-02-20: qty 8

## 2021-02-20 NOTE — Evaluation (Signed)
Clinical/Bedside Swallow Evaluation Patient Details  Name: Wanda Conner MRN: [id number] Date of Birth: [birth year]  Today's Date: 02/20/2021 Time: SLP Start Time (ACUTE ONLY): 56 SLP Stop Time (ACUTE ONLY): 1145 SLP Time Calculation (min) (ACUTE ONLY): 15 min  Past Medical History:  Past Medical History:  Diagnosis Date   Agitation 09/02/2014   Anemia    History of this   Arthritis    Dementia with behavioral disturbance ([place]) 08/21/2014   Depression    Elevated vitamin B12 level 09/02/2014   GERD (gastroesophageal reflux disease)    Hypertension, essential, benign 11/21/2011   Osteoarthritis of left knee 10/31/2011   Status post knee replacement on 10/31/2011.    Palpitations 12/12/2012   Personal history of peptic ulcer disease 11/12/2009   Qualifier: Diagnosis of  By: Mingo Amber MD, Jeff     Seasonal allergies    Urge incontinence 12/29/2010   Uterine prolapse 10/25/2013   Weight loss, unintentional 09/18/2014   Past Surgical History:  Past Surgical History:  Procedure Laterality Date   BREAST SURGERY     cyst removal   JOINT REPLACEMENT     Right kee replacement 2009   TOTAL KNEE ARTHROPLASTY  10/31/2011   Procedure: TOTAL KNEE ARTHROPLASTY;  Surgeon: Kerin Salen, MD;  Location: [facility];  Service: Orthopedics;  Laterality: Left;   HPI:  CAMELLA Conner is a 87 y.o. female with a pertinent PMH of advanced Alzheimer's dementia with behavioral disturbance, hypertension, hyperlipidemia, depression and hydrocephalus who presents to [facility] after witnessed seizure-like activity. History obtained by chart review, patient's son at bedside and nursing facility. Patient is nonverbal and nonambulatory at baseline.  She was diagnosed with breakthrough seizures.  MRI of the head was showing no evidence of acute intracranial abnormality and similar severe atrophy and moderate chronic microvascular ischemic disease.  Chest xray was showing cardiomegaly with central pulmonary congestion.    Assessment /  Plan / Recommendation  Clinical Impression  Clinical swallowing evaluation was completed using thin liquids via spoon and straw, pureed material, dual textured solids and solids from her breakfast tray.  She easily accepted presented material.  Cranial nerve exam was unable to be completed due to her inability to follow commands.  Per RN she is comfort measures.  She presented with a likely oral dysphagia characterized by slow mastication.  She appeared to clear all material from her oral cavity.  Swallow trigger was appreciated to palpation and overt s/s of aspiration were not seen.  Suggest she remain on a regular textured diet given orders for comfort measures.  She may do better with softer, easier to chew menu items.  ST will not follow during acute stay.  If we can be of further assistance please feel free to reconsult. SLP Visit Diagnosis: Dysphagia, oral phase (R13.11)    Aspiration Risk  Mild aspiration risk    Diet Recommendation   Regular with thin liquids (order softer, easier to chew menu items)  Medication Administration: Whole meds with liquid    Other  Recommendations Oral Care Recommendations: Oral care BID    Recommendations for follow up therapy are one component of a multi-disciplinary discharge planning process, led by the attending physician.  Recommendations may be updated based on patient status, additional functional criteria and insurance authorization.  Follow up Recommendations None        Swallow Study   General Date of Onset: 02/18/21 HPI: Wanda Conner is a 87 y.o. female with a pertinent PMH of advanced Alzheimer's dementia  with behavioral disturbance, hypertension, hyperlipidemia, depression and hydrocephalus who presents to [facility] after witnessed seizure-like activity. History obtained by chart review, patient's son at bedside and nursing facility. Patient is nonverbal and nonambulatory at baseline.  She was diagnosed with breakthrough seizures.  MRI of the  head was showing no evidence of acute intracranial abnormality and similar severe atrophy and moderate chronic microvascular ischemic disease.  Chest xray was showing cardiomegaly with central pulmonary congestion. Type of Study: Bedside Swallow Evaluation Previous Swallow Assessment: None noted at [facility]. Diet Prior to this Study: Regular;Thin liquids Temperature Spikes Noted: Yes Respiratory Status: Room air History of Recent Intubation: No Behavior/Cognition: Alert;Cooperative;Doesn't follow directions Oral Cavity Assessment: Within Functional Limits Oral Care Completed by SLP: No Self-Feeding Abilities: Total assist Patient Positioning: Upright in bed Baseline Vocal Quality: Not observed Volitional Cough: Cognitively unable to elicit Volitional Swallow: Unable to elicit    Oral/Motor/Sensory Function Overall Oral Motor/Sensory Function: Other (comment) (Unable to assess)   Ice Chips Ice chips: Not tested   Thin Liquid Thin Liquid: Within functional limits Presentation: Cup;Spoon;Straw    Nectar Thick Nectar Thick Liquid: Not tested   Honey Thick Honey Thick Liquid: Not tested   Puree Puree: Within functional limits Presentation: Spoon   Solid     Solid: Impaired Presentation: Spoon Oral Phase Impairments: Impaired mastication Oral Phase Functional Implications: Prolonged oral transit     Shelly Flatten, MA, CCC-SLP Acute Rehab SLP [phone]  Lamar Sprinkles 02/20/2021,1:23 PM

## 2021-02-20 NOTE — Progress Notes (Addendum)
Daily Progress Note   Patient Name: Wanda Conner       Date: 02/20/2021 DOB: [birth year]  Age: 87 y.o. MRN#: [id number] Attending Physician: Sid Falcon, MD Primary Care Physician: Patient, [name] (Inactive) Admit Date: 02/18/2021  Reason for Consultation/Follow-up: Non pain symptom management, Pain control, Psychosocial/spiritual support, and Terminal Care  Subjective: Chart review performed. Received report from primary RN - no acute concerns.   Went to visit patient at bedside - no family/visitors present. Patient was sitting up in bed awake - she remains nonverbal. Her breakfast tray was at bedside - offered patient milk and small bites of oatmeal, french toast, and sausage, which she accepted. Aspiration precautions taken. Coughing noted with sausage and french toast intake. She requires lots of time to chew and became tired and started to fall asleep after only a few bites of each item. No signs or non-verbal gestures of pain or discomfort noted. No respiratory distress, increased work of breathing, or secretions noted.   Spoke with RN thoughts on transition to oral seizure medication. If given an extended release tablet, patient will likely chew it. I do feel she would be able to tolerate a crushed pill with applesauce.   Length of Stay: 1  Current Medications: Scheduled Meds:   antiseptic oral rinse  15 mL Topical BID   chlorhexidine  15 mL Mouth Rinse BID   feeding supplement  237 mL Oral BID BM   mouth rinse  15 mL Mouth Rinse q12n4p   sodium chloride flush  3 mL Intravenous Q12H    Continuous Infusions:  sodium chloride 10 mL/hr at 02/20/21 0640   levETIRAcetam Stopped (02/20/21 0619)    PRN Meds: sodium chloride, acetaminophen **OR** acetaminophen, glycopyrrolate  **OR** glycopyrrolate **OR** glycopyrrolate, haloperidol **OR** haloperidol **OR** haloperidol lactate, LORazepam **OR** LORazepam **OR** LORazepam, LORazepam, morphine injection, ondansetron **OR** ondansetron (ZOFRAN) IV, polyvinyl alcohol  Physical Exam Vitals and nursing note reviewed.  Constitutional:      General: She is not in acute distress.    Appearance: She is ill-appearing.  Pulmonary:     Effort: No respiratory distress.  Skin:    General: Skin is warm and dry.  Neurological:     Mental Status: She is alert. Mental status is at baseline.     Motor: Weakness present.  Psychiatric:  Speech: She is noncommunicative.            Vital Signs: BP (!) 149/53 (BP Location: Left Arm)   Pulse 72   Temp 97.9 F (36.6 C) (Oral)   Resp 15   Ht 5\' 4"  (1.626 m)   Wt 61.2 kg   SpO2 97%   BMI 23.16 kg/m  SpO2: SpO2: 97 % O2 Device: O2 Device: Room Air O2 Flow Rate:    Intake/output summary:  Intake/Output Summary (Last 24 hours) at 02/20/2021 0926 Last data filed at 02/20/2021 7510 Gross per 24 hour  Intake 586.58 ml  Output 2050 ml  Net -1463.42 ml   LBM: Last BM Date: 02/19/21 Baseline Weight: Weight: 61.2 kg Most recent weight: Weight: 61.2 kg       Palliative Assessment/Data: PPS 20%    Flowsheet Rows    Flowsheet Row Most Recent Value  Intake Tab   Referral Department Hospitalist  Unit at Time of Referral ER  Palliative Care Primary Diagnosis Neurology  Date Notified 02/18/21  Palliative Care Type New Palliative care  Reason for referral Clarify Goals of Care  Date of Admission 02/18/21  Date first seen by Palliative Care 02/18/21  # of days Palliative referral response time 0 Day(s)  # of days IP prior to Palliative referral 0  Clinical Assessment   Psychosocial & Spiritual Assessment   Palliative Care Outcomes   Patient/Family meeting held? Yes  Who was at the meeting? son  Palliative Care Outcomes Improved pain interventions, Improved  non-pain symptom therapy, Clarified goals of care, Counseled regarding hospice, Provided end of life care assistance, Provided psychosocial or spiritual support, Changed to focus on comfort, Transitioned to hospice  Patient/Family wishes: Interventions discontinued/not started  Mechanical Ventilation, Vasopressors, NIPPV, Trach, Transfer out of ICU, BiPAP, Hemodialysis, Antibiotics, PEG, Transfusion, Tube feedings/TPN       Patient Active Problem List   Diagnosis Date Noted   Seizure disorder ([place]) 02/19/2021   Protein-calorie malnutrition, mild ([place])    Seizure ([place]) 10/08/2020   Hypernatremia 08/06/2020   Alzheimer's dementia with behavioral disturbance ([place]) 08/06/2020   Thrombocytopenia ([place]) 08/06/2020   Lactic acidosis 25/85/2778   Acute metabolic encephalopathy 24/23/5361   Open wound of foot except toe(s) alone 11/20/2014   Weight loss, unintentional 09/18/2014   Dementia without behavioral disturbance ([place]) 08/21/2014   Uterine prolapse 10/25/2013   Bony growth 12/12/2012   Hypertension, essential, benign 11/21/2011   Osteoarthritis of left knee 10/31/2011   Preventative health care 12/29/2010   Urge incontinence 12/29/2010    Palliative Care Assessment & Plan   Patient Profile: 87 y.o. female  with past medical history of advanced Alzheimer's dementia with behavioral disturbance, hypertension, thrombocytopenia, hyperlipidemia, depression and hydrocephalus who presents to [facility] ED on 02/18/21 from [place] after witnessed seizure-like activity. Patient was admitted May 19-20, 2022 for seizure-like activity also. Patient was admitted on 02/18/2021 with breakthrough seizure, advanced dementia, protein calorie malnutrition.   Assessment: Breakthrough seizure Advanced Alzheimer's dementia Protein calorie malnutrition Hypothermia Terminal care  Recommendations/Plan: Continue full comfort measures Continue DNR/DNI as previously documented Plan is for discharge back to  [facility] with hospice, if they accept patient back into their care Recommend immediate release tablet or sprinkle capsule as seizure medication is transitioned to oral. After assessment, she will likely chew pills which would make any extended release medications not effective. She likely would do well taking medication in applesauce Continue current comfort focused medications - patient appears comfortable Nursing to provide frequent assessments  and administer PRN medications as clinically necessary to ensure EOL comfort PMT will continue to follow and support holistically  Goals of Care and Additional Recommendations: Limitations on Scope of Treatment: Full Comfort Care  Code Status:    Code Status Orders  (From admission, onward)           Start     Ordered   02/19/21 1647  Do not attempt resuscitation (DNR)  Continuous       Question Answer Comment  In the event of cardiac or respiratory ARREST Do not call a "code blue"   In the event of cardiac or respiratory ARREST Do not perform Intubation, CPR, defibrillation or ACLS   In the event of cardiac or respiratory ARREST Use medication by any route, position, wound care, and other measures to relive pain and suffering. May use oxygen, suction and manual treatment of airway obstruction as needed for comfort.      02/19/21 1654           Code Status History     Date Active Date Inactive Code Status Order ID Comments User Context   02/18/2021 1234 02/19/2021 1654 DNR [id number]  Harvie Heck, MD ED   10/08/2020 1552 10/10/2020 0233 Full Code [id number]  Harvie Heck, MD ED   10/08/2020 1530 10/08/2020 1552 DNR [id number]  Harvie Heck, MD ED   08/06/2020 0456 08/06/2020 1910 DNR [id number]  Vernelle Emerald, MD ED   08/05/2020 2326 08/06/2020 0456 DNR [id number]  Vernelle Emerald, MD ED   10/31/2011 1358 11/03/2011 1410 Full Code [id number]  Carron Brazen, RN Inpatient       Prognosis:  < 6 months  Discharge  Planning: [place] with Hospice  Care plan was discussed with primary RN  Thank you for allowing the Palliative Medicine Team to assist in the care of this patient.   Total Time 25 minutes Prolonged Time Billed  no       Greater than 50%  of this time was spent counseling and coordinating care related to the above assessment and plan.  Lin Landsman, NP  Please contact Palliative Medicine Team phone at [phone] for questions and concerns.

## 2021-02-20 NOTE — Progress Notes (Signed)
[facility]) Hospital Liaison Note  Received request from Marga Hoots, SW with [facility] for hospice services at discharge for Ms. Laborde. Sallye Ober is awaiting confirmation that Ms. Kegel can return to [facility]. [facility] does have a contract with [employer] and will evaluate for hospice services at discharge when discharge disposition confirmed.  [facility] will continue to follow for disposition.  Please call with any hospice related questions or concerns.  Thank you. Margaretmary Eddy, BSN, RN [facility] Liaison [phone]

## 2021-02-20 NOTE — TOC Progression Note (Addendum)
Transition of Care [facility]) - Progression Note    Patient Details  Name: Wanda Conner MRN: [id number] Date of Birth: February 15, 1934  [facility] CM/SW Contact  Ina Homes, [place] Phone Number: 02/20/2021, 11:52 AM  Clinical Narrative:       SW attempted to call Stephan Minister [phone]) multiple times and call dropped each time. SW called pt's son Collins Scotland [phone]) to determine if he utilizes a different number. Collins Scotland confirmed number is [phone]). Collins Scotland also reports he has difficulties on weekends due to staffing.   SW will continue attempts.   Update 1235pm SW spoke with [facility]) confirmed they have contract with facility. Plans to reach out to family once facility confirms pt able to return.   SW attempted to contact facility as well as had another CSW contact facility. Phone line continues to drop each time.    Marga Hoots, MSW, LCSWA

## 2021-02-20 NOTE — Progress Notes (Addendum)
   HD#1 SUBJECTIVE:  Patient Summary: Wanda Conner is a 87 y.o. with a pertinent PMH of Alzheimer's dementia with behavioral disturbance, hypertension, hyperlipidemia, depression, hydrocephalus, who presented with witnessed seizure-like activity and admitted for breakthrough seizures.   Overnight Events: Transitioned to comfort care after discussion with son.  Interim History: The patient was seen at bedside rounds this morning. She is arousable to name and appears more alert compared to exam yesterday. Able to respond with unintelligible speech only.  OBJECTIVE:  Vital Signs: Vitals:   02/19/21 1535 02/19/21 1700 02/19/21 2004 02/20/21 0814  BP: (!) 126/59 134/63 (!) 139/57 (!) 149/53  Pulse: 69 70 65 72  Resp: 16 19 16 15   Temp: (!) 97.2 F (36.2 C) 98.7 F (37.1 C) 97.8 F (36.6 C) 97.9 F (36.6 C)  TempSrc:  Axillary Oral Oral  SpO2: 98% 95% 96% 97%  Weight:      Height:       Supplemental O2: Room Air SpO2: 97 %  Filed Weights   02/18/21 1233  Weight: 61.2 kg     Intake/Output Summary (Last 24 hours) at 02/20/2021 0949 Last data filed at 02/20/2021 0852 Gross per 24 hour  Intake 586.58 ml  Output 2050 ml  Net -1463.42 ml    Net IO Since Admission: -1,463.42 mL [02/20/21 0949]  Physical Exam: Constitutional: Patient is lying in bed,  No acute distress noted. Head: Normocephalic, atraumatic Eyes: Opens eyes to name and while speaking with her Cardio: Regular rate and rhythm. No murmurs, rubs, gallops. Pulm: Normal work of breathing on room air. Abdomen: Soft, nontender, nondistended. MSK: Patient is lying with upper extremities contracted inward. Skin: Skin is warm and dry. Neuro: Able to move extremities but is unable to respond to commands, improved compared to exam yesterday  Patient Lines/Drains/Airways Status     Active Line/Drains/Airways     Name Placement date Placement time Site Days   Peripheral IV 02/18/21 20 G Left;Posterior Hand 02/18/21   0752  Hand  1   Closed System Drain 10/31/11  1126  --  3399   Urethral Catheter JS Straight-tip 14 Fr. 02/18/21  1120  Straight-tip  1   External Urinary Catheter 10/08/20  1930  --  134   Incision 10/31/11 Leg Left 10/31/11  1058  -- 3399             ASSESSMENT/PLAN:  Assessment: Active Problems:   Seizure ([place])   Seizure disorder ([place])  Plan:  #Comfort Care #Advanced Alzheimer Dementia #Breakthrough Seizure -Palliative care is on board, appreciate their recommendations           -Continue comfort care measures  -Neurology on board, appreciate recommendations  -Transitioned to p.o. Depakote (can be given as sprinkled into applesauce). Will give as 1 g oral load at this time, then increase to 250 mg 3 times daily on discharge.  Signature: Mitzie Na, MD Internal Medicine Resident, PGY-1 [facility]  Pager: [phone] 9:49 AM, 02/20/2021   Please contact the on call pager after 5 pm and on weekends at [phone].

## 2021-02-21 LAB — GLUCOSE, CAPILLARY: Glucose-Capillary: 86 mg/dL (ref 70–99)

## 2021-02-21 MED ORDER — DIVALPROEX SODIUM 125 MG PO CSDR
250.0000 mg | DELAYED_RELEASE_CAPSULE | Freq: Three times a day (TID) | ORAL | Status: DC
  Administered 2021-02-21 – 2021-02-23 (×8): 250 mg via ORAL
  Filled 2021-02-21 (×8): qty 2

## 2021-02-21 NOTE — TOC Progression Note (Signed)
Transition of Care [facility]) - Progression Note    Patient Details  Name: BRONWEN PENDERGRAFT MRN: [id number] Date of Birth: [birth year]  Transition of Care [facility]) CM/SW [place], [place] Phone Number: 02/21/2021, 1:48 PM  Clinical Narrative:     SW attempted to call [facility] x3, call continues to drop.  Marga Hoots, MSW, LCSWA

## 2021-02-21 NOTE — Progress Notes (Signed)
Nutrition Brief Note  Chart reviewed. Pt now transitioning to comfort care.  No further nutrition interventions planned at this time.  Please re-consult as needed.   Ashe Graybeal W, RD, LDN, CDCES Registered Dietitian II Certified Diabetes Care and Education Specialist Please refer to AMION for RD and/or RD on-call/weekend/after hours pager   

## 2021-02-21 NOTE — Progress Notes (Addendum)
   HD#2 SUBJECTIVE:  Patient Summary: Wanda Conner is a 87 y.o. with a pertinent PMH of Alzheimer's dementia p/w breathrough seizures, now on comfort care.  Patient was seen at bedside during rounds today.  She is somnolent and only sometimes arousable, continues to have unintelligible speech when talking to [place], which seems to be her baseline.  Spoke with NT in the room who said that patient has eating well and is able to swallow food without issue.   OBJECTIVE:  Vital Signs: Vitals:   02/19/21 1700 02/19/21 2004 02/20/21 0814 02/20/21 2126  BP: 134/63 (!) 139/57 (!) 149/53 (!) 158/75  Pulse: 70 65 72 (!) 57  Resp: 19 16 15 12   Temp: 98.7 F (37.1 C) 97.8 F (36.6 C) 97.9 F (36.6 C) 97.8 F (36.6 C)  TempSrc: Axillary Oral Oral Axillary  SpO2: 95% 96% 97% 93%  Weight:      Height:       Supplemental O2: Room Air SpO2: 93 %  Filed Weights   02/18/21 1233  Weight: 61.2 kg     Intake/Output Summary (Last 24 hours) at 02/21/2021 0626 Last data filed at 02/20/2021 2125 Gross per 24 hour  Intake 195.14 ml  Output 950 ml  Net -754.86 ml    Net IO Since Admission: -2,383.42 mL [02/21/21 0626]  Physical Exam: Patient is sleeping comfortably in bed, she sometimes arouses to name but mostly sleeps during exam.  Lying in bed with legs crossed: spoke with NT in the room who has been frequently repositioning the patient. Bilateral heels are blanchable, no ulcers or wounds appreciated.  Patient Lines/Drains/Airways Status     Active Line/Drains/Airways     Name Placement date Placement time Site Days   Peripheral IV 02/18/21 20 G Left;Posterior Hand 02/18/21  0752  Hand  1   Closed System Drain 10/31/11  1126  --  3399   Urethral Catheter JS Straight-tip 14 Fr. 02/18/21  1120  Straight-tip  1   External Urinary Catheter 10/08/20  1930  --  134   Incision 10/31/11 Leg Left 10/31/11  1058  -- 3399             ASSESSMENT/PLAN:  Assessment: Active Problems:    Seizure ([place])   Seizure disorder ([place])  Plan: #Comfort Care #Advanced Alzheimer Dementia #Breakthrough Seizure -Palliative care is on board, appreciate their recommendations           -Continue comfort care measures  -Neurology on board, appreciate recommendations  -Will transition to depakote to 250 mg 3 times daily  Signature: Mitzie Na, MD Internal Medicine Resident, PGY-1 [facility]  Pager: [phone] 6:26 AM, 02/21/2021   Please contact the on call pager after 5 pm and on weekends at [phone].

## 2021-02-22 DIAGNOSIS — Z515 Encounter for palliative care: Secondary | ICD-10-CM | POA: Diagnosis not present

## 2021-02-22 DIAGNOSIS — R569 Unspecified convulsions: Secondary | ICD-10-CM

## 2021-02-22 DIAGNOSIS — G40909 Epilepsy, unspecified, not intractable, without status epilepticus: Secondary | ICD-10-CM | POA: Diagnosis not present

## 2021-02-22 LAB — GLUCOSE, CAPILLARY: Glucose-Capillary: 98 mg/dL (ref 70–99)

## 2021-02-22 NOTE — Progress Notes (Signed)
   HD#3 SUBJECTIVE:  Patient Summary: Wanda Conner is a 87 y.o. with a pertinent PMH of Alzheimer's dementia p/w breathrough seizures, now on comfort care.  Patient was seen at bedside during rounds today. Pt was sleeping comfortably in bed this morning. She awakens to voice but promptly falls back asleep.  OBJECTIVE:  Vital Signs: Vitals:   02/20/21 2126 02/21/21 0731 02/21/21 1946 02/22/21 0751  BP: (!) 158/75 (!) 110/44 (!) 125/52 (!) 113/47  Pulse: (!) 57 (!) 57 (!) 56 70  Resp: 12 12 12 14   Temp: 97.8 F (36.6 C) 97.8 F (36.6 C) 99 F (37.2 C) 98.4 F (36.9 C)  TempSrc: Axillary Axillary Axillary Axillary  SpO2: 93% 96% 90% 94%  Weight:      Height:       Supplemental O2: Room Air SpO2: 94 %  Filed Weights   02/18/21 1233  Weight: 61.2 kg     Intake/Output Summary (Last 24 hours) at 02/22/2021 0756 Last data filed at 02/22/2021 0540 Gross per 24 hour  Intake 330 ml  Output 400 ml  Net -70 ml    Net IO Since Admission: -2,453.42 mL [02/22/21 0756]  Physical Exam: Patient is sleeping comfortably in bed, she aroused once to voice and then promptly fell back asleep.  Regular rate and rhythm, no murmurs, rubs, or gallops.  Patient Lines/Drains/Airways Status     Active Line/Drains/Airways     Name Placement date Placement time Site Days   Peripheral IV 02/18/21 20 G Left;Posterior Hand 02/18/21  0752  Hand  1   Closed System Drain 10/31/11  1126  --  3399   Urethral Catheter JS Straight-tip 14 Fr. 02/18/21  1120  Straight-tip  1   External Urinary Catheter 10/08/20  1930  --  134   Incision 10/31/11 Leg Left 10/31/11  1058  -- 3399             ASSESSMENT/PLAN:  Assessment: Active Problems:   Seizure ([place])   Seizure disorder ([facility])  Plan: #Comfort Care #Advanced Alzheimer Dementia #Breakthrough Seizure -Palliative care is on board, appreciate their recommendations           -Continue comfort care measures            -Discontinued Foley  catheter today -Neurology on board, appreciate recommendations  -Continue depakote to 250 mg 3 times daily  Signature: Mitzie Na, MD Internal Medicine Resident, PGY-1 [facility]  Pager: [phone] 7:56 AM, 02/22/2021   Please contact the on call pager after 5 pm and on weekends at [phone].

## 2021-02-22 NOTE — Plan of Care (Signed)
  Problem: Education: Goal: Knowledge of General Education information will improve Description: Including pain rating scale, medication(s)/side effects and non-pharmacologic comfort measures Outcome: Progressing   Problem: Coping: Goal: Level of anxiety will decrease Outcome: Progressing   Problem: Pain Managment: Goal: General experience of comfort will improve Outcome: Progressing   Problem: Safety: Goal: Ability to remain free from injury will improve Outcome: Progressing   Problem: Education: Goal: Knowledge of the prescribed therapeutic regimen will improve Outcome: Progressing   Problem: Coping: Goal: Ability to identify and develop effective coping behavior will improve Outcome: Progressing   Problem: Clinical Measurements: Goal: Quality of life will improve Outcome: Progressing   Problem: Respiratory: Goal: Verbalizations of increased ease of respirations will increase Outcome: Progressing   Problem: Role Relationship: Goal: Family's ability to cope with current situation will improve Outcome: Progressing Goal: Ability to verbalize concerns, feelings, and thoughts to partner or family member will improve Outcome: Progressing   Problem: Pain Management: Goal: Satisfaction with pain management regimen will improve Outcome: Progressing

## 2021-02-22 NOTE — Care Management Important Message (Signed)
Important Message  Patient Details  Name: Wanda Conner MRN: [id number] Date of Birth: 10-Jun-1933   Medicare Important Message Given:  Yes Patient is at end of life our to respect  IM mailed to the patient home address.     Pascual Mantel 02/22/2021, 3:04 PM

## 2021-02-22 NOTE — NC FL2 (Signed)
[place] MEDICAID FL2 LEVEL OF CARE SCREENING TOOL     IDENTIFICATION  Patient Name: Wanda Conner Birthdate: 11/28/1933 Sex: female Admission Date (Current Location): 02/18/2021  [facility] and [place] Number:  Herbalist and Address:  [facility], [place] [address], [place], [place] [address]      Provider Number: [id number]  Attending Physician Name and Address:  Lucious Groves, DO  Relative Name and Phone Number:       Current Level of Care: Hospital Recommended Level of Care: Memory Care (SCU) Prior Approval Number:    Date Approved/Denied:   PASRR Number:    Discharge Plan: Other (Comment) (Memory Care SCU)    Current Diagnoses: Patient Active Problem List   Diagnosis Date Noted   Alzheimer's dementia with behavioral disturbance ([place]) 08/06/2020   Seizure disorder ([place]) 02/19/2021   Protein-calorie malnutrition, mild ([place])    Seizure ([place]) 10/08/2020   Hypernatremia 08/06/2020   Thrombocytopenia ([place]) 08/06/2020   Lactic acidosis 91/47/8295   Acute metabolic encephalopathy 62/13/0865   Open wound of foot except toe(s) alone 11/20/2014   Weight loss, unintentional 09/18/2014   Dementia without behavioral disturbance ([place]) 08/21/2014   Uterine prolapse 10/25/2013   Bony growth 12/12/2012   Hypertension, essential, benign 11/21/2011   Osteoarthritis of left knee 10/31/2011   Preventative health care 12/29/2010   Urge incontinence 12/29/2010   Orientation RESPIRATION BLADDER Height & Weight      (Disoriented x4)  Normal Incontinent Weight: 134 lb 14.7 oz (61.2 kg) Height:  5\' 4"  (162.6 cm)  BEHAVIORAL SYMPTOMS/MOOD NEUROLOGICAL BOWEL NUTRITION STATUS      Incontinent Diet (Regular)  AMBULATORY STATUS COMMUNICATION OF NEEDS Skin   Extensive Assist Verbally Normal                       Personal Care Assistance Level of Assistance  Bathing, Feeding, Dressing Bathing Assistance: Maximum assistance Feeding assistance:  Maximum assistance Dressing Assistance: Maximum assistance     Functional Limitations Info             SPECIAL CARE FACTORS FREQUENCY                       Contractures Contractures Info: Not present    Additional Factors Info  Code Status, Allergies Code Status Info: DNR Allergies Info: Omeprazole           Current Medications (02/22/2021):  This is the current hospital active medication list Current Facility-Administered Medications  Medication Dose Route Frequency Provider Last Rate Last Admin   0.9 %  sodium chloride infusion   Intravenous PRN Sid Falcon, MD 10 mL/hr at 02/20/21 0640 Restarted at 02/20/21 0640   acetaminophen (TYLENOL) tablet 650 mg  650 mg Oral Q6H PRN Harvie Heck, MD       Or   acetaminophen (TYLENOL) suppository 650 mg  650 mg Rectal Q6H PRN Aslam, Sadia, MD   650 mg at 02/20/21 0545   chlorhexidine (PERIDEX) 0.12 % solution 15 mL  15 mL Mouth Rinse BID Gilles Chiquito B, MD   15 mL at 02/22/21 1027   divalproex (DEPAKOTE SPRINKLE) capsule 250 mg  250 mg Oral Q8H Orvis Brill, MD   250 mg at 02/22/21 1300   glycopyrrolate (ROBINUL) tablet 1 mg  1 mg Oral Q4H PRN Lin Landsman, NP       Or   glycopyrrolate (ROBINUL) injection 0.2 mg  0.2 mg  Subcutaneous Q4H PRN Lin Landsman, NP       Or   glycopyrrolate (ROBINUL) injection 0.2 mg  0.2 mg Intravenous Q4H PRN Lin Landsman, NP       haloperidol (HALDOL) tablet 2 mg  2 mg Oral Q6H PRN Lin Landsman, NP       Or   haloperidol (HALDOL) 2 MG/ML solution 2 mg  2 mg Sublingual Q6H PRN Lin Landsman, NP       Or   haloperidol lactate (HALDOL) injection 2 mg  2 mg Intravenous Q6H PRN Lin Landsman, NP       LORazepam (ATIVAN) tablet 1 mg  1 mg Oral Q1H PRN Lin Landsman, NP       Or   LORazepam (ATIVAN) 2 MG/ML concentrated solution 1 mg  1 mg Sublingual Q1H PRN Lin Landsman, NP       Or   LORazepam (ATIVAN) injection 1 mg  1 mg Intravenous Q1H PRN Lin Landsman, NP   1 mg at  02/22/21 0051   LORazepam (ATIVAN) injection 4 mg  4 mg Intravenous Q5 Min x 2 PRN Tacy Learn, PA-C       MEDLINE mouth rinse  15 mL Mouth Rinse q12n4p Gilles Chiquito B, MD   15 mL at 02/22/21 1300   morphine 2 MG/ML injection 1-2 mg  1-2 mg Intravenous Q2H PRN Lin Landsman, NP   2 mg at 02/22/21 1027   ondansetron (ZOFRAN-ODT) disintegrating tablet 4 mg  4 mg Oral Q6H PRN Lin Landsman, NP       Or   ondansetron [facility]) injection 4 mg  4 mg Intravenous Q6H PRN Lin Landsman, NP       polyvinyl alcohol (LIQUIFILM TEARS) 1.4 % ophthalmic solution 1 drop  1 drop Both Eyes QID PRN Lin Landsman, NP       sodium chloride flush (NS) 0.9 % injection 3 mL  3 mL Intravenous Q12H Harvie Heck, MD   3 mL at 02/22/21 1028     Discharge Medications: Please see discharge summary for a list of discharge medications.  Relevant Imaging Results:  Relevant Lab Results:   Additional Information SSN [id number]. Hospice to follow at memory care.  Benard Halsted, LCSW

## 2021-02-22 NOTE — Progress Notes (Signed)
Daily Progress Note   Patient Name: Wanda Conner       Date: 02/22/2021 DOB: 1934/05/12  Age: 87 y.o. MRN#: [id number] Attending Physician: Lucious Groves, DO Primary Care Physician: Patient, [name] (Inactive) Admit Date: 02/18/2021  Reason for Consultation/Follow-up: Non pain symptom management, Pain control, Psychosocial/spiritual support, and Terminal Care  Subjective: Chart review performed. Received report from primary RN - no acute concerns. Reports patient's PO intake is minimal to sips at this point. RN also reports increased symptom management needs for pain while turning patient - she is premedicating before turning. Concern patient will no longer be able to tolerate PO depakote soon.   Went to visit patient at bedside - no family/visitors present. NT's giving patient a bath/changing linens. Patient was lying in bed with her eyes closed - she remains nonverbal. No signs or non-verbal gestures of pain or discomfort noted. No respiratory distress, increased work of breathing, or secretions noted.    Length of Stay: 3  Current Medications: Scheduled Meds:   chlorhexidine  15 mL Mouth Rinse BID   divalproex  250 mg Oral Q8H   mouth rinse  15 mL Mouth Rinse q12n4p   sodium chloride flush  3 mL Intravenous Q12H    Continuous Infusions:  sodium chloride 10 mL/hr at 02/20/21 0640    PRN Meds: sodium chloride, acetaminophen **OR** acetaminophen, glycopyrrolate **OR** glycopyrrolate **OR** glycopyrrolate, haloperidol **OR** haloperidol **OR** haloperidol lactate, LORazepam **OR** LORazepam **OR** LORazepam, LORazepam, morphine injection, ondansetron **OR** ondansetron (ZOFRAN) IV, polyvinyl alcohol  Physical Exam Vitals and nursing note reviewed.  Constitutional:       General: She is not in acute distress.    Appearance: She is ill-appearing.  Pulmonary:     Effort: No respiratory distress.  Skin:    General: Skin is warm and dry.  Neurological:     Mental Status: She is lethargic.     Motor: Weakness present.  Psychiatric:        Speech: She is noncommunicative.            Vital Signs: BP (!) 113/47 (BP Location: Left Arm)   Pulse 70   Temp 98.4 F (36.9 C) (Axillary)   Resp 14   Ht 5\' 4"  (1.626 m)   Wt 61.2 kg   SpO2 94%   BMI  23.16 kg/m  SpO2: SpO2: 94 % O2 Device: O2 Device: Room Air O2 Flow Rate:    Intake/output summary:  Intake/Output Summary (Last 24 hours) at 02/22/2021 1034 Last data filed at 02/22/2021 0540 Gross per 24 hour  Intake 330 ml  Output 400 ml  Net -70 ml   LBM: Last BM Date: 02/21/21 Baseline Weight: Weight: 61.2 kg Most recent weight: Weight: 61.2 kg       Palliative Assessment/Data: PPS 10-20%    Flowsheet Rows    Flowsheet Row Most Recent Value  Intake Tab   Referral Department Hospitalist  Unit at Time of Referral ER  Palliative Care Primary Diagnosis Neurology  Date Notified 02/18/21  Palliative Care Type New Palliative care  Reason for referral Clarify Goals of Care  Date of Admission 02/18/21  Date first seen by Palliative Care 02/18/21  # of days Palliative referral response time 0 Day(s)  # of days IP prior to Palliative referral 0  Clinical Assessment   Psychosocial & Spiritual Assessment   Palliative Care Outcomes   Patient/Family meeting held? Yes  Who was at the meeting? son  Palliative Care Outcomes Improved pain interventions, Improved non-pain symptom therapy, Clarified goals of care, Counseled regarding hospice, Provided end of life care assistance, Provided psychosocial or spiritual support, Changed to focus on comfort, Transitioned to hospice  Patient/Family wishes: Interventions discontinued/not started  Mechanical Ventilation, Vasopressors, NIPPV, Trach, Transfer out of ICU,  BiPAP, Hemodialysis, Antibiotics, PEG, Transfusion, Tube feedings/TPN       Patient Active Problem List   Diagnosis Date Noted   Seizure disorder ([place]) 02/19/2021   Protein-calorie malnutrition, mild ([place])    Seizure ([place]) 10/08/2020   Hypernatremia 08/06/2020   Alzheimer's dementia with behavioral disturbance ([place]) 08/06/2020   Thrombocytopenia ([place]) 08/06/2020   Lactic acidosis 78/93/8101   Acute metabolic encephalopathy 75/02/2584   Open wound of foot except toe(s) alone 11/20/2014   Weight loss, unintentional 09/18/2014   Dementia without behavioral disturbance ([place]) 08/21/2014   Uterine prolapse 10/25/2013   Bony growth 12/12/2012   Hypertension, essential, benign 11/21/2011   Osteoarthritis of left knee 10/31/2011   Preventative health care 12/29/2010   Urge incontinence 12/29/2010    Palliative Care Assessment & Plan   Patient Profile: 87 y.o. female  with past medical history of advanced Alzheimer's dementia with behavioral disturbance, hypertension, thrombocytopenia, hyperlipidemia, depression and hydrocephalus who presents to [facility] ED on 02/18/21 from [place] after witnessed seizure-like activity. Patient was admitted May 19-20, 2022 for seizure-like activity also. Patient was admitted on 02/18/2021 with breakthrough seizure, advanced dementia, protein calorie malnutrition.   Assessment: Breakthrough seizure Advanced Alzheimer's dementia Protein calorie malnutrition Hypothermia Terminal care  Recommendations/Plan: Continue full comfort measures Continue DNR/DNI as previously documented Plan is for discharge back to [facility] with hospice, if they will accept patient back into their care. Patient may now be becoming Optometrist appropriate Continue current comfort focused medication regimen - patient appears comfortable Nursing to provide frequent assessments and administer PRN medications as clinically necessary to ensure EOL comfort PMT will continue  to follow and support holistically  Goals of Care and Additional Recommendations: Limitations on Scope of Treatment: Full Comfort Care  Code Status:    Code Status Orders  (From admission, onward)           Start     Ordered   02/19/21 1647  Do not attempt resuscitation (DNR)  Continuous       Question Answer Comment  In the event  of cardiac or respiratory ARREST Do not call a "code blue"   In the event of cardiac or respiratory ARREST Do not perform Intubation, CPR, defibrillation or ACLS   In the event of cardiac or respiratory ARREST Use medication by any route, position, wound care, and other measures to relive pain and suffering. May use oxygen, suction and manual treatment of airway obstruction as needed for comfort.      02/19/21 1654           Code Status History     Date Active Date Inactive Code Status Order ID Comments User Context   02/18/2021 1234 02/19/2021 1654 DNR [id number]  Harvie Heck, MD ED   10/08/2020 1552 10/10/2020 0233 Full Code [id number]  Harvie Heck, MD ED   10/08/2020 1530 10/08/2020 1552 DNR [id number]  Harvie Heck, MD ED   08/06/2020 0456 08/06/2020 1910 DNR [id number]  Vernelle Emerald, MD ED   08/05/2020 2326 08/06/2020 0456 DNR [id number]  Vernelle Emerald, MD ED   10/31/2011 1358 11/03/2011 1410 Full Code [id number]  Carron Brazen, RN Inpatient       Prognosis:  < 2 weeks   Discharge Planning: [place] with Hospice   Care plan was discussed with primary RN  Thank you for allowing the Palliative Medicine Team to assist in the care of this patient.   Total Time 25 minutes Prolonged Time Billed  no       Greater than 50%  of this time was spent counseling and coordinating care related to the above assessment and plan.  Lin Landsman, NP  Please contact Palliative Medicine Team phone at [phone] for questions and concerns.

## 2021-02-22 NOTE — Progress Notes (Signed)
Manufacturing engineer [facility]) Hospital Liaison Note:  Received referral for residential hospice for this patient from [facility].  Spoke to son Collins [place] to confirm interest and answer questions. Hospice eligibility under review at this time.  Unfortunately, [facility] does not have a bed to offer today.  The [facility] Team will continue to follow and update family and TOC daily on bed availability.  Thank you for this referral,  Gar Ponto, RN [facility] HLT [phone]

## 2021-02-22 NOTE — TOC Progression Note (Addendum)
Transition of Care [facility]) - Progression Note    Patient Details  Name: DARNISHA VERNET MRN: [id number] Date of Birth: [birth year]  Transition of Care [facility]) CM/SW [place], LCSW Phone Number: 02/22/2021, 10:20 AM  Clinical Narrative:    10:20am-CSW spoke with Crystal at [facility]. She is concerned about the level of care patient requires and the fact that she has a foley. CSW inquiring about need for foley at discharge and also sent message to [place] to inquire on if she would qualify for residential hospice.   12pm-CSW contacted patient's son, Collins [place], to provide an update. He reported being in agreement with an assessment for patient by [place] to see if she qualifies for [facility], but he would prefer patient to return to [facility]. He is going to call them to see what the issue it.   2pm-CSW spoke with Crystal at [facility] and informed her that patient does not need the foley at discharge. She requested CSW fax and FL2 for review. CSW faxed to [phone].       Expected Discharge Plan and Services                                                 Social Determinants of Health (SDOH) Interventions    Readmission Risk Interventions No flowsheet data found.

## 2021-02-23 DIAGNOSIS — G40909 Epilepsy, unspecified, not intractable, without status epilepticus: Secondary | ICD-10-CM | POA: Diagnosis not present

## 2021-02-23 DIAGNOSIS — Z515 Encounter for palliative care: Secondary | ICD-10-CM

## 2021-02-23 DIAGNOSIS — R569 Unspecified convulsions: Secondary | ICD-10-CM | POA: Diagnosis not present

## 2021-02-23 LAB — GLUCOSE, CAPILLARY: Glucose-Capillary: 80 mg/dL (ref 70–99)

## 2021-02-23 LAB — CULTURE, BLOOD (ROUTINE X 2)
Culture: NO GROWTH
Culture: NO GROWTH
Special Requests: ADEQUATE

## 2021-02-23 MED ORDER — MIDAZOLAM HCL (PF) 5 MG/ML IJ SOLN: 5.0000 mg | Freq: Once | INTRAMUSCULAR | 0 refills | Status: DC | PRN

## 2021-02-23 MED ORDER — LORAZEPAM 1 MG PO TABS: 1.0000 mg | ORAL_TABLET | ORAL | 0 refills | Status: AC | PRN

## 2021-02-23 MED ORDER — HALOPERIDOL 2 MG PO TABS: 2.0000 mg | ORAL_TABLET | Freq: Four times a day (QID) | ORAL | Status: AC | PRN

## 2021-02-23 MED ORDER — ORAL CARE MOUTH RINSE: 15.0000 mL | Freq: Two times a day (BID) | OROMUCOSAL | 0 refills | Status: AC

## 2021-02-23 MED ORDER — MIDAZOLAM HCL (PF) 5 MG/ML IJ SOLN: 5.0000 mg | Freq: Once | INTRAMUSCULAR | 0 refills | Status: DC

## 2021-02-23 MED ORDER — MORPHINE SULFATE (PF) 2 MG/ML IV SOLN: 1.0000 mg | INTRAVENOUS | 0 refills | Status: AC | PRN

## 2021-02-23 MED ORDER — POLYVINYL ALCOHOL 1.4 % OP SOLN: 1.0000 [drp] | Freq: Four times a day (QID) | OPHTHALMIC | 0 refills | Status: AC | PRN

## 2021-02-23 MED ORDER — GLYCOPYRROLATE 1 MG PO TABS: 1.0000 mg | ORAL_TABLET | ORAL | Status: AC | PRN

## 2021-02-23 MED ORDER — CHLORHEXIDINE GLUCONATE 0.12 % MT SOLN: 15.0000 mL | Freq: Two times a day (BID) | OROMUCOSAL | 0 refills | Status: DC

## 2021-02-23 MED ORDER — DIVALPROEX SODIUM 125 MG PO CSDR: 250.0000 mg | DELAYED_RELEASE_CAPSULE | Freq: Three times a day (TID) | ORAL | Status: AC

## 2021-02-23 MED ORDER — MIDAZOLAM HCL (PF) 5 MG/ML IJ SOLN: 5.0000 mg | Freq: Once | INTRAMUSCULAR | 0 refills | Status: AC

## 2021-02-23 MED ORDER — LORAZEPAM 2 MG/ML IJ SOLN: 4.0000 mg | INTRAMUSCULAR | 0 refills | Status: DC | PRN

## 2021-02-23 MED ORDER — ONDANSETRON 4 MG PO TBDP: 4.0000 mg | ORAL_TABLET | Freq: Four times a day (QID) | ORAL | 0 refills | Status: AC | PRN

## 2021-02-23 NOTE — TOC Transition Note (Signed)
Transition of Care [facility]) - CM/SW Discharge Note   Patient Details  Name: Wanda Conner MRN: [id number] Date of Birth: 12-14-1933  Transition of Care [facility]) CM/SW Contact:  Benard Halsted, LCSW Phone Number: 02/23/2021, 2:50 PM   Clinical Narrative:    Patient will DC to: [facility] Anticipated DC date: 02/23/21 Family notified: Son Transport by: Corey Harold   Per MD patient ready for DC to [facility]. RN to call report prior to discharge [phone]). RN, patient, patient's family, and facility notified of DC. Discharge Summary sent to facility. DC packet on chart. Ambulance transport requested for patient.   CSW will sign off for now as social work intervention is no longer needed. Please consult [place] again if new needs arise.     Final next level of care: [place] Barriers to Discharge: Barriers Resolved   Patient Goals and CMS Choice Patient states their goals for this hospitalization and ongoing recovery are:: Comfort CMS Medicare.gov Compare Post Acute Care list provided to:: Patient Represenative (must comment) Choice offered to / list presented to : Adult Children  Discharge Placement              Patient chooses bed at: Other - please specify in the comment section below: Patient to be transferred to facility by: [place] Name of family member notified: Son Patient and family notified of of transfer: 02/23/21  Discharge Plan and Services In-house Referral: Clinical Social Work, Hospice / [facility] Acute Care Choice: Hospice                               Social Determinants of Health (SDOH) Interventions     Readmission Risk Interventions No flowsheet data found.

## 2021-02-23 NOTE — Progress Notes (Signed)
Manufacturing engineer [facility]) Hospital Liaison note.    Chart reviewed and eligibility confirmed for [facility]. Spoke with family to confirm interest and explain services. Family agreeable to transfer today. TOC aware.    [facility] will notify TOC when registration paperwork has been completed to arrange transport.   RN please call report to [phone].  Please be sure the signed DNR form transports with the patient.  Thank you for the opportunity to participate in this patient's care.  Domenic Moras, BSN, RN [facility] Liaison (listed on [place] under Hospice/[facility])    [phone] (24h on call)

## 2021-02-23 NOTE — TOC Progression Note (Signed)
Transition of Care [facility]) - Progression Note    Patient Details  Name: Wanda Conner MRN: [id number] Date of Birth: February 28, 1934  Transition of Care [facility]) CM/SW [place], LCSW Phone Number: 02/23/2021, 8:43 AM  Clinical Narrative:    Per Crystal at [facility], they can take her back once medications on the discharge summary are reviewed.         Expected Discharge Plan and Services                                                 Social Determinants of Health (SDOH) Interventions    Readmission Risk Interventions No flowsheet data found.

## 2021-02-23 NOTE — Discharge Instructions (Signed)
Wanda Conner was admitted to the hospital due to having a breakthrough seizure.  She was transitioned to comfort care during her hospitalization.  We are transferring her to [facility] today.  Her medications will be given per hospice team there.

## 2021-02-23 NOTE — Discharge Summary (Addendum)
Name: Wanda Conner MRN: [id number] DOB: [birth year] 87 y.o. PCP: Patient, [name] (Inactive)  Date of Admission: 02/18/2021  6:41 AM Date of Discharge: 02/23/2021 Attending Physician: Lucious Groves, DO  Discharge Diagnosis: 1. Breakthrough seizure 2. Advanced Alzheimer dementia 3. Protein calorie malnutrition 4. Hypothermia 5. Hypertension 6. Macrocytic anemia  Discharge Medications: Allergies as of 02/23/2021       Reactions   Omeprazole Other (See Comments)   "It did me worse than it did good." Pt does not like this medication to treat her stomach symptoms.        Medication List     STOP taking these medications    acetaminophen 500 MG tablet Commonly known as: TYLENOL   amLODipine 5 MG tablet Commonly known as: NORVASC   donepezil 10 MG tablet Commonly known as: ARICEPT   guaifenesin 100 MG/5ML syrup Commonly known as: ROBITUSSIN   loperamide 2 MG capsule Commonly known as: IMODIUM   losartan 100 MG tablet Commonly known as: COZAAR   magnesium hydroxide 400 MG/5ML suspension Commonly known as: MILK OF MAGNESIA   Mintox [phone] MG/5ML suspension Generic drug: alum & mag hydroxide-simeth   OVER THE COUNTER MEDICATION   sertraline 25 MG tablet Commonly known as: ZOLOFT   Triple Antibiotic First Aid [phone] Oint   Vitamin D3 Super Strength 50 MCG (2000 UT) Caps Generic drug: Cholecalciferol       TAKE these medications    divalproex 125 MG capsule Commonly known as: DEPAKOTE SPRINKLE Take 2 capsules (250 mg total) by mouth 3 (three) times daily. What changed: when to take this   divalproex 125 MG capsule Commonly known as: DEPAKOTE SPRINKLE Take 2 capsules (250 mg total) by mouth every 8 (eight) hours. What changed: You were already taking a medication with the same name, and this prescription was added. Make sure you understand how and when to take each.   glycopyrrolate 1 MG tablet Commonly known as: ROBINUL Take 1  tablet (1 mg total) by mouth every 4 (four) hours as needed (excessive secretions).   haloperidol 2 MG tablet Commonly known as: HALDOL Take 1 tablet (2 mg total) by mouth every 6 (six) hours as needed for agitation (or delirium).   LORazepam 1 MG tablet Commonly known as: ATIVAN Take 1 tablet (1 mg total) by mouth every hour as needed for anxiety or sedation (distress, increased work of breathing).   midazolam PF 5 MG/ML injection Commonly known as: VERSED Inject 1 mL (5 mg total) into the muscle once as needed for up to 1 dose (aggitation).   morphine 2 MG/ML injection Inject 0.5-1 mLs (1-2 mg total) into the vein every 2 (two) hours as needed (dyspnea, increased work of breathing, distress, respiratory rate >25).   mouth rinse Liqd solution 15 mLs by Mouth Rinse route 2 times daily at 12 noon and 4 pm.   ondansetron 4 MG disintegrating tablet Commonly known as: ZOFRAN-ODT Take 1 tablet (4 mg total) by mouth every 6 (six) hours as needed for nausea.   polyvinyl alcohol 1.4 % ophthalmic solution Commonly known as: LIQUIFILM TEARS Place 1 drop into both eyes 4 (four) times daily as needed for dry eyes.        Disposition and follow-up:   Wanda Conner was discharged from [facility] in Stable condition (end of life, comfort care). At the hospital follow up visit please address:  1.  Comfort care: Patient was transitioned from IV keppra to PO depakote  during this hospitalization. This is considered a comfort care measure (see below).  2.  Labs / imaging needed at time of follow-up: None  3.  Pending labs/ test needing follow-up: None  Follow-up Appointments:   Hospital Course by problem list: Comfort care Breakthrough seizure Advanced Alzheimer dementia Patient presented to ED 9/29 after experiencing a witnessed seizure-like activity including full body shaking while sitting in her wheelchair resulting in a fall where she hit her head and had  drooling.  CT head showed no acute intracranial abnormality or acute traumatic injury, chronic ischemic disease and ventriculomegaly.  CT C-spine showed no acute traumatic injury, mild T1 superior endplate compression fracture that appears to be chronic, cervical spine degeneration, aortic atherosclerosis.  MRI brain showed no evidence of acute intracranial abnormality, severe atrophy and moderate chronic microvascular ischemic disease.  Chest x-ray showed cardiomegaly with central pulmonary vascular congestion.  CBC, CMP, UA within normal limits at time of presentation.  Valproic acid level subtherapeutic at 29.  She was initiated on IV Keppra 500 mg twice daily.  EEG was also completed which was suggestive of moderate diffuse encephalopathy with nonspecific etiology, no seizures or definite epileptiform discharges. On 10/01, the patient was transitioned to comfort care, and the palliative care team was consulted. That day, the patient was able to tolerate oral intake and was transitioned from IV keppra to PO depakote (this is considered a comfort care measure). She was first given a 1g oral load, and then on 10/2 she was transitioned to 250 mg TID, which she continued through discharge. Also DC on IM midazolam PRN for seizures.  Protein calorie malnutrition SLP evaluation ordered and showed mild aspiration risk, and patient was transitioned to regular with thin liquid diet.  Hypothermia Patient was hypothermic to 30 4C on arrival.  She was placed on a Retail banker.  She did receive 1 dose of Rocephin in the ED out of concerns for possible infection, however UA did not show signs of infection and she had no leukocytosis.  Blood cultures were drawn and showed no growth. Her temperature was monitored and otherwise stable.   Hypertension Chronically managed with amlodipine. This was held.  Chronic macrocytic anemia Hemoglobin 10.8, hematocrit 34.3, MCV 103.0.  Otherwise, her home medications were  continued for her chronic medical conditions until she was transitioned to comfort care measures.  Discharge Exam:   BP (!) 107/52 (BP Location: Left Arm)   Pulse 61   Temp 97.9 F (36.6 C) (Oral)   Resp 16   Ht 5\' 4"  (1.626 m)   Wt 61.2 kg   SpO2 93%   BMI 23.16 kg/m  Discharge exam: Patient is lying comfortably in bed, arouses sometimes to voice. Unintelligible mumbling at times. CV: Regular rate and rhythm, no m/r/g. Abdomen is soft.   Pertinent Labs, Studies, and Procedures:  CBC Latest Ref Rng & Units 02/20/2021 02/19/2021 02/18/2021  WBC 4.0 - 10.5 K/uL 9.0 9.2 9.2  Hemoglobin 12.0 - 15.0 g/dL 11.1(L) 10.8(L) 12.1  Hematocrit 36.0 - 46.0 % 34.5(L) 34.3(L) 38.0  Platelets 150 - 400 K/uL 219 226 268    BMP Latest Ref Rng & Units 02/20/2021 02/19/2021 02/18/2021  Glucose 70 - 99 mg/dL 86 88 91  BUN 8 - 23 mg/dL 17 23 23   Creatinine 0.44 - 1.00 mg/dL 0.87 1.26(H) 0.70  Sodium 135 - 145 mmol/L 141 140 140  Potassium 3.5 - 5.1 mmol/L 4.1 4.5 4.4  Chloride 98 - 111 mmol/L 108 108 105  CO2  22 - 32 mmol/L 24 24 27   Calcium 8.9 - 10.3 mg/dL 8.6(L) 8.5(L) 8.9    CT Head Wo Contrast  Result Date: 02/18/2021 CLINICAL DATA:  87 year old female status post seizure and fall. EXAM: CT HEAD WITHOUT CONTRAST TECHNIQUE: Contiguous axial images were obtained from the base of the skull through the vertex without intravenous contrast. COMPARISON:  Brain MRI and head CT 10/08/2020. FINDINGS: Brain: Stable cerebral volume. Chronic ventriculomegaly. No midline shift, mass effect, or evidence of intracranial mass lesion. Chronic encephalomalacia lateral right perirolandic cortex. Stable confluent bilateral white matter hypodensity. Small chronic right cerebellar infarct is stable (coronal image 55). No cortically based acute infarct identified. No acute intracranial hemorrhage identified. Stable basal ganglia vascular calcifications. Vascular: Calcified atherosclerosis at the skull base. No suspicious  intracranial vascular hyperdensity. Skull: Stable and intact. Sinuses/Orbits: Visualized paranasal sinuses and mastoids are stable and well aerated. Other: Mild left forehead scalp soft tissue scarring. No acute orbit or scalp soft tissue injury. IMPRESSION: 1. No acute intracranial abnormality or acute traumatic injury identified. 2. Stable non contrast CT appearance of the brain with chronic ischemic disease and ventriculomegaly. Electronically Signed   By: Genevie Ann M.D.   On: 02/18/2021 08:02   CT Cervical Spine Wo Contrast  Result Date: 02/18/2021 CLINICAL DATA:  87 year old female status post seizure and fall. EXAM: CT CERVICAL SPINE WITHOUT CONTRAST TECHNIQUE: Multidetector CT imaging of the cervical spine was performed without intravenous contrast. Multiplanar CT image reconstructions were also generated. COMPARISON:  Head CT today, and earlier. Brain MRI 10/08/2020. Chest CT 12/19/2012. FINDINGS: Alignment: Preserved cervical lordosis. Cervicothoracic junction alignment is within normal limits. Bilateral posterior element alignment is within normal limits. There is mild degenerative appearing anterolisthesis of C6 on C7, with associated left side facet hypertrophy. Skull base and vertebrae: Visualized skull base is intact. No atlanto-occipital dissociation. C1 and C2 appear aligned and intact. No acute osseous abnormality identified. Soft tissues and spinal canal: No prevertebral fluid or swelling. No visible canal hematoma. Negative visible noncontrast neck soft tissues aside from calcified carotid atherosclerosis. Disc levels: Widespread cervical facet hypertrophy and disc bulging but generally mild for age cervical spine degeneration. No convincing spinal stenosis. Upper chest: Mild compression of the T1 superior endplate is new since 3428, but appears to be chronic along with a chronic fracture at the head of the right 1st rib. Other visible upper thoracic levels appear grossly intact. Calcified  aortic atherosclerosis. Negative lung apices. IMPRESSION: 1. No acute traumatic injury identified in the cervical spine. 2. Mild T1 superior endplate compression fracture is new since 2014, but appears to be chronic. 3. Mild for age cervical spine degeneration. 4. Aortic Atherosclerosis (ICD10-I70.0). Electronically Signed   By: Genevie Ann M.D.   On: 02/18/2021 08:09   MR BRAIN WO CONTRAST  Result Date: 02/18/2021 CLINICAL DATA:  Seizure, abnormal neuro exam EXAM: MRI HEAD WITHOUT CONTRAST TECHNIQUE: Multiplanar, multiecho pulse sequences of the brain and surrounding structures were obtained without intravenous contrast. COMPARISON:  Same day CT head.  MRI Oct 08, 2020. FINDINGS: Mildly motion limited study. Brain: No acute infarction, hemorrhage, hydrocephalus, extra-axial collection or mass lesion. Small remote cortical right parietal infarct with evidence of prior hemorrhage, similar. Similar moderate patchy and confluent T2 hyperintensities in the white matter, nonspecific but compatible with chronic microvascular ischemic disease. Similar ventriculomegaly, favored ex vacuo in the setting of severe atrophy. The callosal angle is not acute and there is no crowding of sulci at the vertex. Vascular: Major arterial flow voids  are maintained at the skull base. Skull and upper cervical spine: Normal marrow signal. Sinuses/Orbits: Minimal paranasal sinus mucosal thickening. Unremarkable orbits. Other: No sizable mastoid effusions. IMPRESSION: 1. No evidence of acute intracranial abnormality. 2. Similar severe atrophy and moderate chronic microvascular ischemic disease. Electronically Signed   By: Margaretha Sheffield M.D.   On: 02/18/2021 16:53   DG CHEST PORT 1 VIEW  Result Date: 02/18/2021 CLINICAL DATA:  Hypothermia. EXAM: PORTABLE CHEST 1 VIEW COMPARISON:  Chest x-ray 08/05/2020. FINDINGS: The aorta is tortuous with atherosclerotic calcifications. The heart is mildly enlarged. There is central pulmonary vascular  congestion. There is no focal lung infiltrate, pleural effusion or pneumothorax. There are severe degenerative changes of the right shoulder. IMPRESSION: 1. Cardiomegaly with central pulmonary vascular congestion. Electronically Signed   By: Ronney Asters M.D.   On: 02/18/2021 17:58   EEG adult  Result Date: 02/18/2021 Lora Havens, MD     02/18/2021  3:34 PM Patient Name: Wanda Conner MRN: [id number] Epilepsy Attending: Lora Havens Referring Physician/Provider: Dr Harvie Heck Date: 02/18/2021 Duration: 23.24 mins Patient history: 87 year old female who presented from her SN[facility] for evaluation of seizure-like activity with shaking, fall out of wheelchair, foaming at the mouth and without return to baseline mental status. EEG to evaluate for seizure Level of alertness: Awake, asleep AEDs during EEG study: None Technical aspects: This EEG study was done with scalp electrodes positioned according to the 10-20 International system of electrode placement. Electrical activity was acquired at a sampling rate of 500Hz  and reviewed with a high frequency filter of 70Hz  and a low frequency filter of 1Hz . EEG data were recorded continuously and digitally stored. Description: No posterior dominant rhythm was seen. Sleep was characterized by sleep spindles (12 to 14 Hz), maximal frontocentral region.  EEG showed continuous generalized 3 to 6 Hz theta-delta slowing. Hyperventilation and photic stimulation were not performed.   ABNORMALITY - Continuous slow, generalized IMPRESSION: This study is suggestive of moderate diffuse encephalopathy, nonspecific etiology. No seizures or definite epileptiform discharges were seen throughout the recording. Lora Havens     SignedLucious Groves, DO 02/23/2021, 4:38 PM   Pager: [phone]

## 2021-02-23 NOTE — Progress Notes (Addendum)
Transported via [facility]. Was told by am nurse report was called and son aware of transfer. Pt has all belongings.

## 2021-04-22 DEATH — deceased

## 2023-08-26 IMAGING — MR MR HEAD W/O CM
9 of 13 series · 34 of 48 positions shown · non-contrast
Comparison: Same day CT head.  MRI October 08, 2020.

CLINICAL DATA: Seizure, abnormal neuro exam

EXAM:
MRI HEAD WITHOUT CONTRAST
TECHNIQUE: Multiplanar, multiecho pulse sequences of the brain and surrounding
structures were obtained without intravenous contrast.

[Series 3: DWI · axial · 3.0mm · 1.09mm/px · z∈[-55,+103]mm · 8 of 108 slices shown (1 of 4)]
[im 1/108]
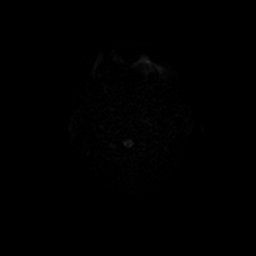
[im 16/108]
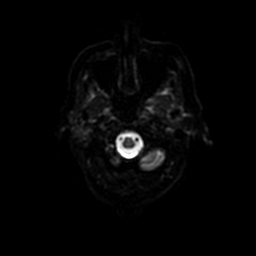
[im 31/108]
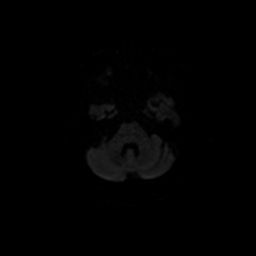
[im 46/108]
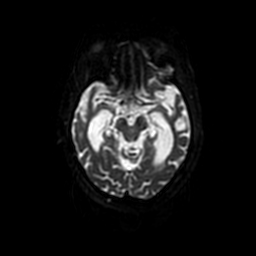
[im 62/108]
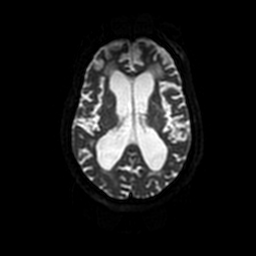
[im 77/108]
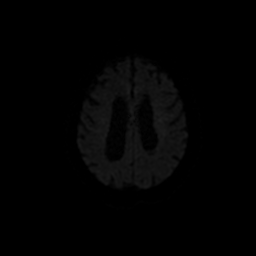
[im 92/108]
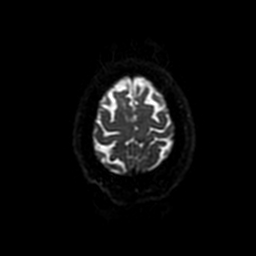
[im 108/108]
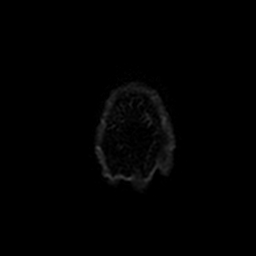

[Series 4: DWI · coronal · 5.0mm · 1.09mm/px · 7 of 80 slices shown (2 of 4)]
[im 1/80]
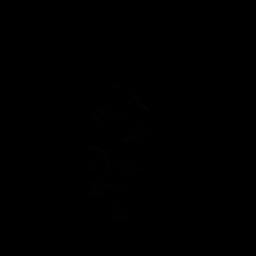
[im 14/80]
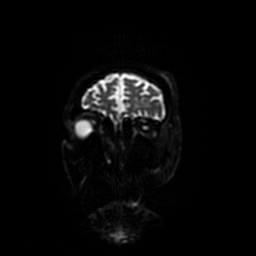
[im 27/80]
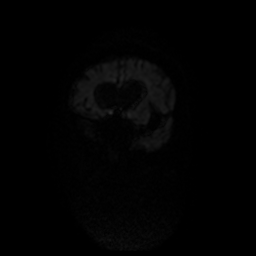
[im 40/80]
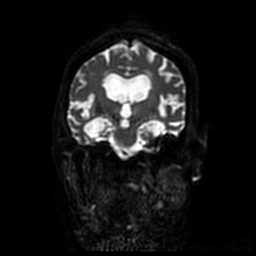
[im 53/80]
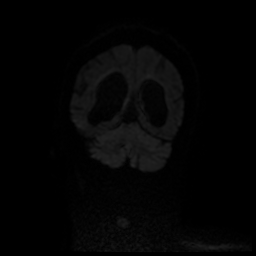
[im 66/80]
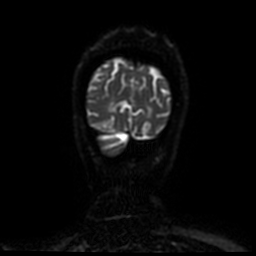
[im 80/80]
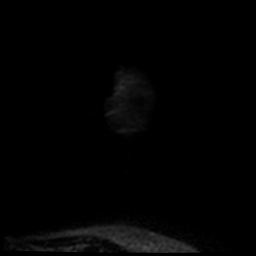

[Series 5: FLAIR · sagittal · 5.0mm · 0.47mm/px · 2 of 22 slices shown (1 of 3)]
[im 1/22]
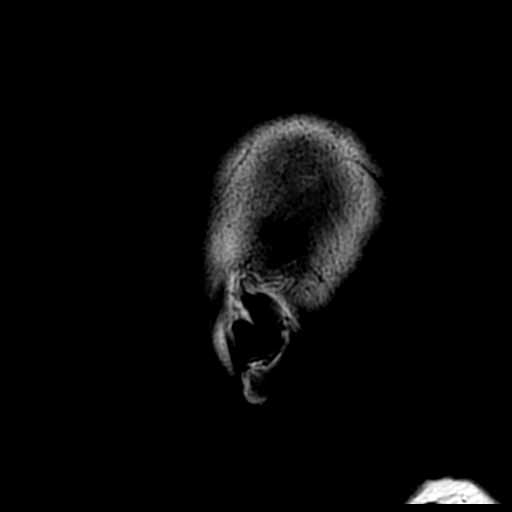
[im 22/22]
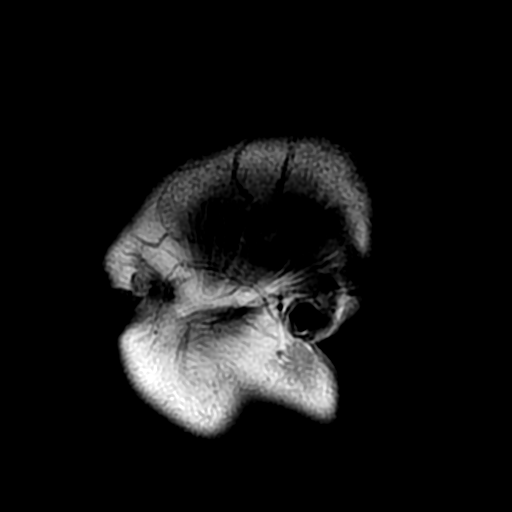

[Series 7: FLAIR · coronal · 3.0mm · 0.37mm/px · 3 of 32 slices shown (2 of 3)]
[im 1/32]
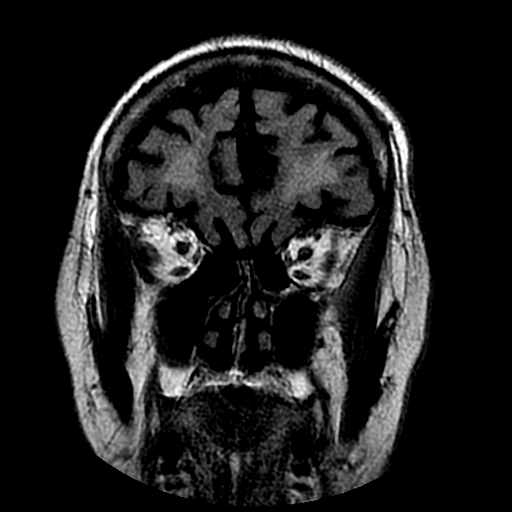
[im 16/32]
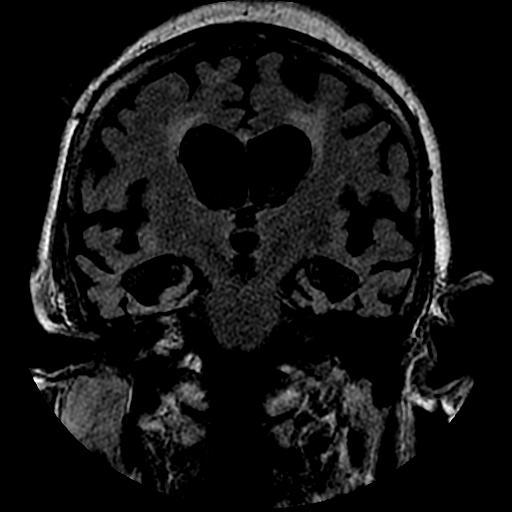
[im 32/32]
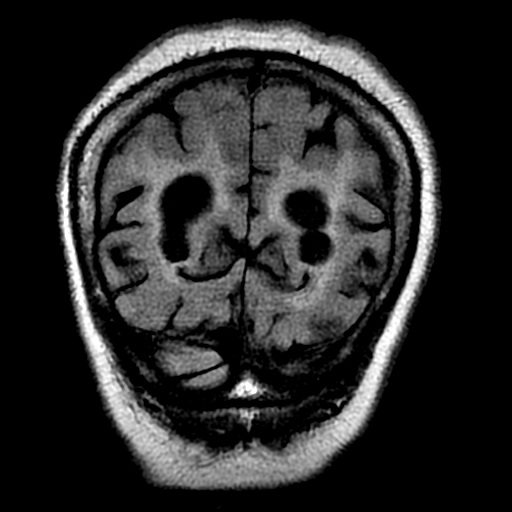

[Series 8: T2 · coronal · 3.0mm · 0.37mm/px · 3 of 32 slices shown (1 of 2)]
[im 1/32]
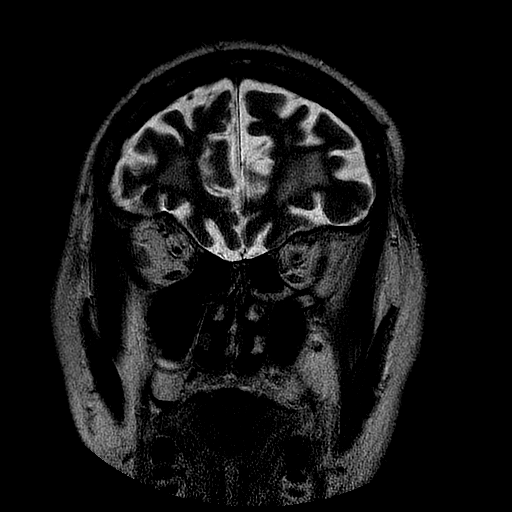
[im 16/32]
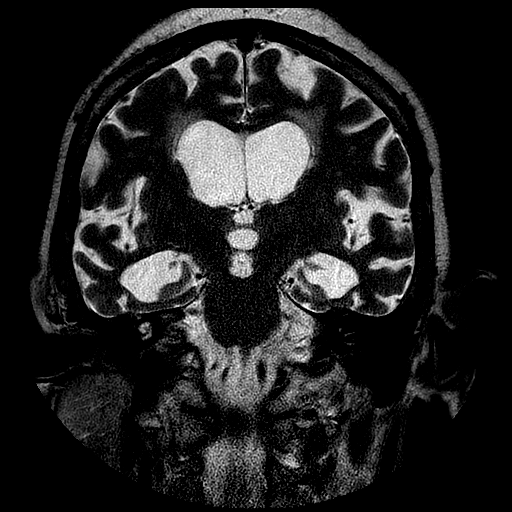
[im 32/32]
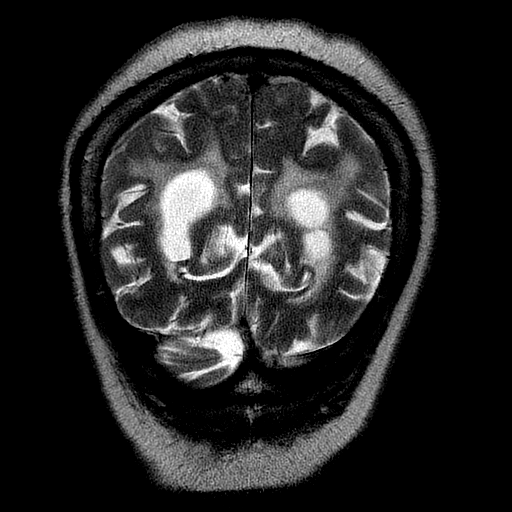

[Series 9: T2 · axial · 5.0mm · 0.47mm/px · z∈[-50,+94]mm · 2 of 25 slices shown (2 of 2)]
[im 1/25]
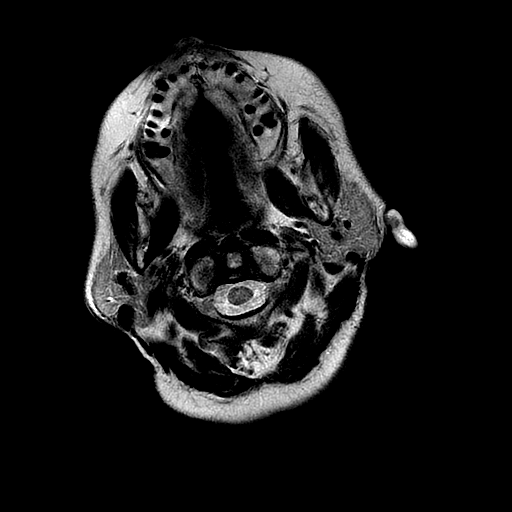
[im 25/25]
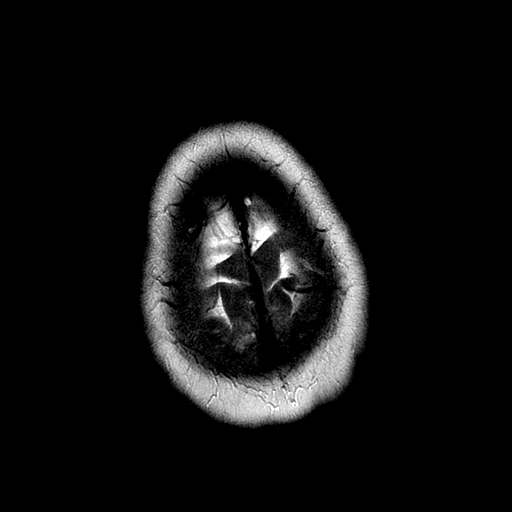

[Series 10: FLAIR · axial · 5.0mm · 0.47mm/px · z∈[-50,+94]mm · 2 of 25 slices shown (3 of 3)]
[im 1/25]
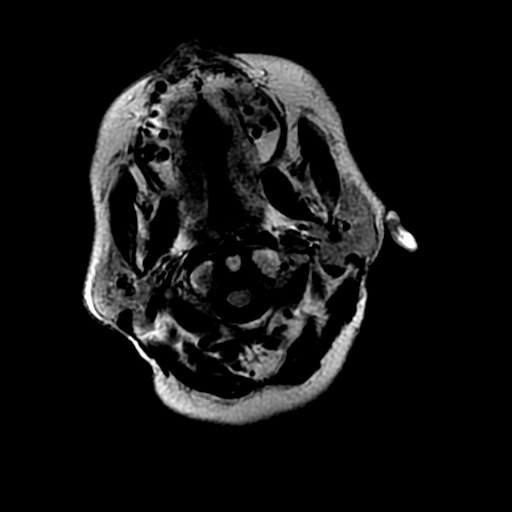
[im 25/25]
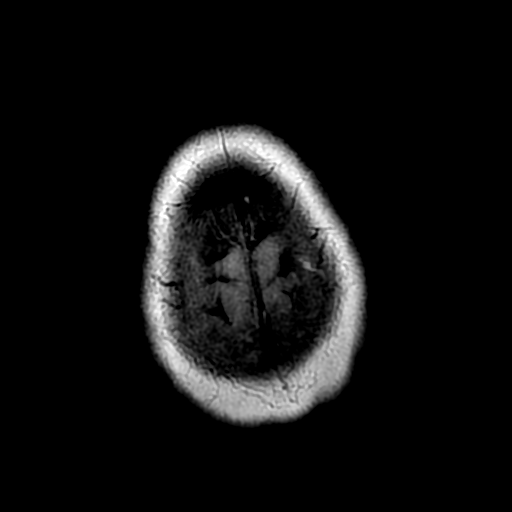

[Series 300: DWI · axial · 3.0mm · 1.09mm/px · z∈[-55,+103]mm · 4 of 54 slices shown (3 of 4)]
[im 1/54]
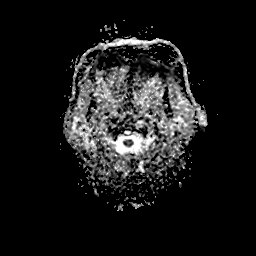
[im 18/54]
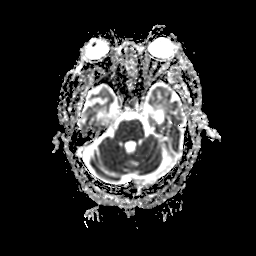
[im 36/54]
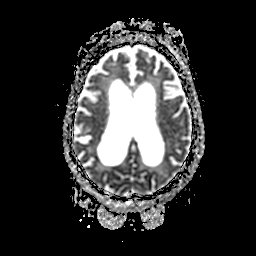
[im 54/54]
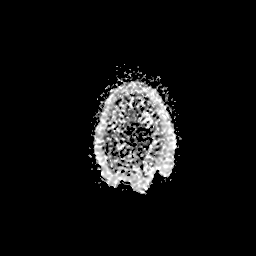

[Series 400: DWI · coronal · 5.0mm · 1.09mm/px · 3 of 38 slices shown (4 of 4)]
[im 1/38]
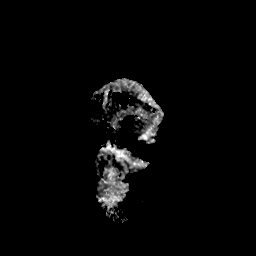
[im 19/38]
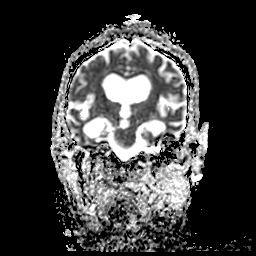
[im 38/38]
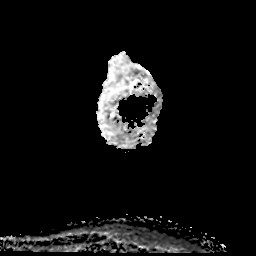

[34 of 48 positions shown; findings below may reference images not displayed]

FINDINGS: Mildly motion limited study.

Brain: No acute infarction, hemorrhage, hydrocephalus, extra-axial
collection or mass lesion. Small remote cortical right parietal
infarct with evidence of prior hemorrhage, similar. Similar moderate
patchy and confluent T2 hyperintensities in the white matter,
nonspecific but compatible with chronic microvascular ischemic
disease. Similar ventriculomegaly, favored ex vacuo in the setting
of severe atrophy. The callosal angle is not acute and there is no
crowding of sulci at the vertex.

Vascular: Major arterial flow voids are maintained at the skull
base.

Skull and upper cervical spine: Normal marrow signal.

Sinuses/Orbits: Minimal paranasal sinus mucosal thickening.
Unremarkable orbits.

Other: No sizable mastoid effusions.
IMPRESSION: 1. No evidence of acute intracranial abnormality.
2. Similar severe atrophy and moderate chronic microvascular
ischemic disease.
